# Patient Record
Sex: Female | Born: 1995 | Race: Black or African American | Hispanic: No | Marital: Single | Smoking: Current some day smoker
Health system: Southern US, Community
[De-identification: ages and names within clinical notes are randomized; demographics above are authoritative.]

## PROBLEM LIST (undated history)

## (undated) DIAGNOSIS — I82409 Acute embolism and thrombosis of unspecified deep veins of unspecified lower extremity: Secondary | ICD-10-CM

---

## 2018-05-22 DIAGNOSIS — I2699 Other pulmonary embolism without acute cor pulmonale: Secondary | ICD-10-CM

## 2018-05-22 HISTORY — DX: Other pulmonary embolism without acute cor pulmonale: I26.99

## 2019-12-31 ENCOUNTER — Emergency Department: Payer: Self-pay

## 2019-12-31 ENCOUNTER — Observation Stay: Payer: Self-pay

## 2019-12-31 ENCOUNTER — Other Ambulatory Visit: Payer: Self-pay

## 2019-12-31 ENCOUNTER — Inpatient Hospital Stay
Admission: EM | Admit: 2019-12-31 | Discharge: 2020-01-06 | DRG: 270 | Disposition: A | Discharge status: Home / Self Care | Payer: Self-pay | Attending: Internal Medicine | Admitting: Internal Medicine

## 2019-12-31 DIAGNOSIS — Z86711 Personal history of pulmonary embolism: Secondary | ICD-10-CM

## 2019-12-31 DIAGNOSIS — D696 Thrombocytopenia, unspecified: Secondary | ICD-10-CM

## 2019-12-31 DIAGNOSIS — D6959 Other secondary thrombocytopenia: Secondary | ICD-10-CM | POA: Diagnosis present

## 2019-12-31 DIAGNOSIS — I82422 Acute embolism and thrombosis of left iliac vein: Secondary | ICD-10-CM | POA: Diagnosis present

## 2019-12-31 DIAGNOSIS — Z7901 Long term (current) use of anticoagulants: Secondary | ICD-10-CM

## 2019-12-31 DIAGNOSIS — I8290 Acute embolism and thrombosis of unspecified vein: Secondary | ICD-10-CM

## 2019-12-31 DIAGNOSIS — I82409 Acute embolism and thrombosis of unspecified deep veins of unspecified lower extremity: Secondary | ICD-10-CM | POA: Diagnosis present

## 2019-12-31 DIAGNOSIS — R000 Tachycardia, unspecified: Secondary | ICD-10-CM | POA: Diagnosis present

## 2019-12-31 DIAGNOSIS — I82220 Acute embolism and thrombosis of inferior vena cava: Secondary | ICD-10-CM | POA: Diagnosis present

## 2019-12-31 DIAGNOSIS — I82432 Acute embolism and thrombosis of left popliteal vein: Secondary | ICD-10-CM | POA: Diagnosis present

## 2019-12-31 DIAGNOSIS — Z86718 Personal history of other venous thrombosis and embolism: Secondary | ICD-10-CM

## 2019-12-31 DIAGNOSIS — Z20822 Contact with and (suspected) exposure to covid-19: Secondary | ICD-10-CM | POA: Diagnosis present

## 2019-12-31 DIAGNOSIS — I824Y2 Acute embolism and thrombosis of unspecified deep veins of left proximal lower extremity: Secondary | ICD-10-CM

## 2019-12-31 DIAGNOSIS — I82442 Acute embolism and thrombosis of left tibial vein: Secondary | ICD-10-CM | POA: Diagnosis present

## 2019-12-31 DIAGNOSIS — D509 Iron deficiency anemia, unspecified: Secondary | ICD-10-CM

## 2019-12-31 DIAGNOSIS — I824Z2 Acute embolism and thrombosis of unspecified deep veins of left distal lower extremity: Secondary | ICD-10-CM

## 2019-12-31 DIAGNOSIS — I2693 Single subsegmental pulmonary embolism without acute cor pulmonale: Secondary | ICD-10-CM

## 2019-12-31 DIAGNOSIS — I82412 Acute embolism and thrombosis of left femoral vein: Principal | ICD-10-CM | POA: Diagnosis not present

## 2019-12-31 DIAGNOSIS — F17290 Nicotine dependence, other tobacco product, uncomplicated: Secondary | ICD-10-CM | POA: Diagnosis present

## 2019-12-31 DIAGNOSIS — D72829 Elevated white blood cell count, unspecified: Secondary | ICD-10-CM | POA: Diagnosis present

## 2019-12-31 DIAGNOSIS — Z6841 Body Mass Index (BMI) 40.0 and over, adult: Secondary | ICD-10-CM

## 2019-12-31 DIAGNOSIS — I2699 Other pulmonary embolism without acute cor pulmonale: Secondary | ICD-10-CM | POA: Diagnosis present

## 2019-12-31 DIAGNOSIS — G43909 Migraine, unspecified, not intractable, without status migrainosus: Secondary | ICD-10-CM | POA: Diagnosis present

## 2019-12-31 DIAGNOSIS — I959 Hypotension, unspecified: Secondary | ICD-10-CM | POA: Diagnosis not present

## 2019-12-31 HISTORY — DX: Acute embolism and thrombosis of unspecified deep veins of unspecified lower extremity: I82.409

## 2019-12-31 LAB — CBC WITH DIFFERENTIAL/PLATELET
Abs Immature Granulocytes: 0.06 10*3/uL (ref 0.00–0.07)
Basophils Absolute: 0.1 10*3/uL (ref 0.0–0.1)
Basophils Relative: 1 %
Eosinophils Absolute: 0.3 10*3/uL (ref 0.0–0.5)
Eosinophils Relative: 2 %
HCT: 31.9 % — ABNORMAL LOW (ref 36.0–46.0)
Hemoglobin: 9.8 g/dL — ABNORMAL LOW (ref 12.0–15.0)
Immature Granulocytes: 0 %
Lymphocytes Relative: 19 %
Lymphs Abs: 2.7 10*3/uL (ref 0.7–4.0)
MCH: 23.8 pg — ABNORMAL LOW (ref 26.0–34.0)
MCHC: 30.7 g/dL (ref 30.0–36.0)
MCV: 77.6 fL — ABNORMAL LOW (ref 80.0–100.0)
Monocytes Absolute: 0.6 10*3/uL (ref 0.1–1.0)
Monocytes Relative: 5 %
Neutro Abs: 10.3 10*3/uL — ABNORMAL HIGH (ref 1.7–7.7)
Neutrophils Relative %: 73 %
Platelets: 120 10*3/uL — ABNORMAL LOW (ref 150–400)
RBC: 4.11 MIL/uL (ref 3.87–5.11)
RDW: 18.6 % — ABNORMAL HIGH (ref 11.5–15.5)
WBC: 14.2 10*3/uL — ABNORMAL HIGH (ref 4.0–10.5)
nRBC: 0 % (ref 0.0–0.2)

## 2019-12-31 LAB — COMPREHENSIVE METABOLIC PANEL
ALT: 14 U/L (ref 0–44)
AST: 17 U/L (ref 15–41)
Albumin: 4.2 g/dL (ref 3.5–5.0)
Alkaline Phosphatase: 63 U/L (ref 38–126)
Anion gap: 12 (ref 5–15)
BUN: 12 mg/dL (ref 6–20)
CO2: 25 mmol/L (ref 22–32)
Calcium: 9 mg/dL (ref 8.9–10.3)
Chloride: 102 mmol/L (ref 98–111)
Creatinine, Ser: 0.79 mg/dL (ref 0.44–1.00)
GFR calc Af Amer: >60 mL/min (ref >60)
GFR calc non Af Amer: >60 mL/min (ref >60)
Glucose, Bld: 110 mg/dL — ABNORMAL HIGH (ref 70–99)
Potassium: 3.7 mmol/L (ref 3.5–5.1)
Sodium: 139 mmol/L (ref 135–145)
Total Bilirubin: 0.3 mg/dL (ref 0.3–1.2)
Total Protein: 8.1 g/dL (ref 6.5–8.1)

## 2019-12-31 LAB — PROTIME-INR
INR: 1.1 (ref 0.8–1.2)
Prothrombin Time: 13.6 seconds (ref 11.4–15.2)

## 2019-12-31 LAB — URINALYSIS, COMPLETE (UACMP) WITH MICROSCOPIC
Bilirubin Urine: NEGATIVE
Glucose, UA: NEGATIVE mg/dL
Ketones, ur: NEGATIVE mg/dL
Nitrite: NEGATIVE
Protein, ur: 30 mg/dL — AB
Specific Gravity, Urine: 1.024 (ref 1.005–1.030)
pH: 5 (ref 5.0–8.0)

## 2019-12-31 LAB — POCT PREGNANCY, URINE: Preg Test, Ur: NEGATIVE

## 2019-12-31 LAB — BRAIN NATRIURETIC PEPTIDE: B Natriuretic Peptide: 52.3 pg/mL (ref 0.0–100.0)

## 2019-12-31 LAB — SARS CORONAVIRUS 2 BY RT PCR (HOSPITAL ORDER, PERFORMED IN ~~LOC~~ HOSPITAL LAB): SARS Coronavirus 2: NEGATIVE

## 2019-12-31 LAB — APTT: aPTT: 95 seconds — ABNORMAL HIGH (ref 24–36)

## 2019-12-31 LAB — LACTIC ACID, PLASMA
Lactic Acid, Venous: 1.3 mmol/L (ref 0.5–1.9)
Lactic Acid, Venous: 2 mmol/L (ref 0.5–1.9)

## 2019-12-31 MED ORDER — IOHEXOL 350 MG/ML SOLN
75.0000 mL | Freq: Once | INTRAVENOUS | Status: AC | PRN
  Administered 2019-12-31: 75 mL via INTRAVENOUS

## 2019-12-31 MED ORDER — HEPARIN (PORCINE) 25000 UT/250ML-% IV SOLN
1200.0000 [IU]/h | INTRAVENOUS | Status: DC
  Administered 2019-12-31: 1000 [IU]/h via INTRAVENOUS
  Administered 2020-01-01: 1200 [IU]/h via INTRAVENOUS
  Filled 2019-12-31 (×2): qty 250

## 2019-12-31 MED ORDER — MORPHINE SULFATE (PF) 2 MG/ML IV SOLN
1.0000 mg | INTRAVENOUS | Status: DC | PRN
  Administered 2019-12-31 – 2020-01-01 (×2): 1 mg via INTRAVENOUS
  Filled 2019-12-31 (×2): qty 1

## 2019-12-31 MED ORDER — ONDANSETRON 4 MG PO TBDP
4.0000 mg | ORAL_TABLET | Freq: Once | ORAL | Status: AC
  Administered 2019-12-31: 4 mg via ORAL
  Filled 2019-12-31: qty 1

## 2019-12-31 MED ORDER — OXYCODONE-ACETAMINOPHEN 5-325 MG PO TABS
1.0000 | ORAL_TABLET | Freq: Once | ORAL | Status: AC
  Administered 2019-12-31: 1 via ORAL
  Filled 2019-12-31: qty 1

## 2019-12-31 MED ORDER — HEPARIN BOLUS VIA INFUSION
4200.0000 [IU] | Freq: Once | INTRAVENOUS | Status: AC
  Administered 2019-12-31: 4200 [IU] via INTRAVENOUS
  Filled 2019-12-31: qty 4200

## 2019-12-31 NOTE — ED Notes (Signed)
Transport tech requested.

## 2019-12-31 NOTE — ED Triage Notes (Signed)
First nurse note- pt here for leg pain.  Hx DVT, has not been taking her blood thinners.  Taking ASA only. VSS with EMS.

## 2019-12-31 NOTE — Progress Notes (Addendum)
Cross Cover Brief Note Paged from [facility] radiologist who reported patient to have pulmonary embolism left lower lobe.  Patient already with heparin drip order for DVT treatment. Secure chat to current attending, Dr. Cyndia Bent, sent

## 2019-12-31 NOTE — H&P (Signed)
History and Physical    [facility] FHL:[id number] DOB: 11-Aug-1995 DOA: 12/31/2019  PCP: Patient, No Pcp Per  Patient coming from: Truck station  I have personally briefly reviewed patient's old medical records in [facility]  Chief Complaint: Left lower extremity pain and swelling  HPI: Caroline Barry is a 24 y.o. female with medical history significant for PE, central venous thrombosis, migraine, tobacco use who presents with concern of left leg edema and increased pain.  Patient says that for the past 2 days she has noticed increased asymmetric swelling of her left lower leg.  Also began to notice painful cramps from her hip down to her knees.  Also from knee down to her feet she feels numbness and tingling.  She has a hard time walking due to the pain behind her knees.  Patient uses tobacco and smokes cigars.  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  She had history of PE last year and a central venous thrombus about 2 years ago.  She was never told the reasoning for this.  She was started on Eliquis but stopped due to financial constraints.  Now she only takes aspirin occasionally.  She denies any chest pain or shortness of breath which she has felt before during her prior PE.  She denies any use of hormonal birth control.  Denies any family history of clotting disorders.  ED Course: She was tachycardic and normotensive on room air. CBC shows leukocytosis of 14.2, microcytic anemia of 9.8 with no prior for comparison.  Platelet of 120.  Review of Systems:  Constitutional: No Weight Change, No Fever ENT/Mouth: No sore throat, No Rhinorrhea Eyes: No Eye Pain, No Vision Changes Cardiovascular: No Chest Pain, no SOB, +Edema, No Palpitations Respiratory: No Cough, No Sputum Gastrointestinal: No Nausea, No Vomiting, No Diarrhea, No Constipation, No Pain Genitourinary: no Urinary Incontinence Musculoskeletal: No Arthralgias,  No Myalgias Skin: No Skin Lesions, No Pruritus, Neuro: no Weakness, + Numbness Psych: No Anxiety/Panic, No Depression, no decrease appetite Heme/Lymph: No Bruising, No Bleeding  Past Medical History:  Diagnosis Date  . DVT (deep venous thrombosis) (HCC)     History reviewed. No pertinent surgical history.   reports that she has been smoking. She does not have any smokeless tobacco history on file. She reports current alcohol use. No history on file for drug use.  Drinks about 1 bottle of wine every time she is home from her truck driving.  States the last time she drank was 2 weeks ago.  Social History  No Known Allergies  No known family history of clotting disorders.  Prior to Admission medications   Medication Sig Start Date End Date Taking? Authorizing Provider  apixaban (ELIQUIS) 5 MG TABS tablet Take 5 mg by mouth 2 (two) times daily.   Yes [provider]    Physical Exam: Vitals:   12/31/19 1425 12/31/19 1945  BP: (!) 144/115 117/63  Pulse: (!) 130 98  Resp: 16 16  Temp: 98.2 F (36.8 C) 98.3 F (36.8 C)  TempSrc: Oral Oral  SpO2: 100% 100%  Weight: 63 kg   Height: 5\' 5"  (1.651 m)     Constitutional: NAD, calm, comfortable, young female laying at 40 degree incline in bed Vitals:   12/31/19 1425 12/31/19 1945  BP: (!) 144/115 117/63  Pulse: (!) 130 98  Resp: 16 16  Temp: 98.2 F (36.8 C) 98.3 F (36.8 C)  TempSrc: Oral Oral  SpO2:  100% 100%  Weight: 63 kg   Height: 5\' 5"  (1.651 m)    Eyes: PERRL, lids and conjunctivae normal ENMT: Mucous membranes are moist.  Neck: normal, supple Respiratory: clear to auscultation bilaterally, no wheezing, no crackles. Normal respiratory effort. No accessory muscle use.  Cardiovascular: Sinus tachycardia, no murmurs / rubs / gallops.  +3 nonpitting edema of the left lower extremity up to hip compared to the right.  Difficulty palpating left dorsalis pedis pulse due to edema.   Abdomen: no tenderness, no  masses palpated.  Bowel sounds positive.  Musculoskeletal: no clubbing / cyanosis. No joint deformity upper and lower extremities. Good ROM, no contractures. Normal muscle tone.  Skin: no rashes, lesions, ulcers. No induration Neurologic: CN 2-12 grossly intact. Sensation intact, Strength 5/5 in all 4.  Psychiatric: Normal judgment and insight. Alert and oriented x 3. Normal mood.     Labs on Admission: I have personally reviewed following labs and imaging studies  CBC: Recent Labs  Lab 12/31/19 1602  WBC 14.2*  NEUTROABS 10.3*  HGB 9.8*  HCT 31.9*  MCV 77.6*  PLT 120*   Basic Metabolic Panel: Recent Labs  Lab 12/31/19 1602  NA 139  K 3.7  CL 102  CO2 25  GLUCOSE 110*  BUN 12  CREATININE 0.79  CALCIUM 9.0   GFR: Estimated Creatinine Clearance: 96.7 mL/min (by C-G formula based on SCr of 0.79 mg/dL). Liver Function Tests: Recent Labs  Lab 12/31/19 1602  AST 17  ALT 14  ALKPHOS 63  BILITOT 0.3  PROT 8.1  ALBUMIN 4.2   No results for input(s): LIPASE, AMYLASE in the last 168 hours. No results for input(s): AMMONIA in the last 168 hours. Coagulation Profile: Recent Labs  Lab 12/31/19 1602  INR 1.1   Cardiac Enzymes: No results for input(s): CKTOTAL, CKMB, CKMBINDEX, TROPONINI in the last 168 hours. BNP (last 3 results) No results for input(s): PROBNP in the last 8760 hours. HbA1C: No results for input(s): HGBA1C in the last 72 hours. CBG: No results for input(s): GLUCAP in the last 168 hours. Lipid Profile: No results for input(s): CHOL, HDL, LDLCALC, TRIG, CHOLHDL, LDLDIRECT in the last 72 hours. Thyroid Function Tests: No results for input(s): TSH, T4TOTAL, FREET4, T3FREE, THYROIDAB in the last 72 hours. Anemia Panel: No results for input(s): VITAMINB12, FOLATE, FERRITIN, TIBC, IRON, RETICCTPCT in the last 72 hours. Urine analysis:    Component Value Date/Time   COLORURINE YELLOW (A) 12/31/2019 1603   APPEARANCEUR CLOUDY (A) 12/31/2019 1603    LABSPEC 1.024 12/31/2019 1603   PHURINE 5.0 12/31/2019 1603   GLUCOSEU NEGATIVE 12/31/2019 1603   HGBUR MODERATE (A) 12/31/2019 1603   BILIRUBINUR NEGATIVE 12/31/2019 1603   KETONESUR NEGATIVE 12/31/2019 1603   PROTEINUR 30 (A) 12/31/2019 1603   NITRITE NEGATIVE 12/31/2019 1603   LEUKOCYTESUR MODERATE (A) 12/31/2019 1603    Radiological Exams on Admission: 03/01/2020 Venous Img Lower Unilateral Left  Result Date: 12/31/2019 CLINICAL DATA:  PE. EXAM: LEFT LOWER EXTREMITY VENOUS DOPPLER ULTRASOUND TECHNIQUE: Gray-scale sonography with compression, as well as color and duplex ultrasound, were performed to evaluate the deep venous system(s) from the level of the common femoral vein through the popliteal and proximal calf veins. COMPARISON:  None. FINDINGS: VENOUS There is extensive occlusive thrombus throughout the left lower extremity including the left CFV, profundus femoral, superficial femoral vein, popliteal vein, and tibial veins. Limited views of the contralateral common femoral vein are unremarkable. OTHER Lower extremity edema is noted. Limitations: none IMPRESSION: Extensive  occlusive thrombus is noted involving essentially all of the deep veins of the left lower extremity. These results were called by telephone at the time of interpretation on 12/31/2019 at 5:15 pm to provider JENISE MENSHEW , who verbally acknowledged these results. Electronically Signed   By: Katherine Mantle M.D.   On: 12/31/2019 17:18      Assessment/Plan  DVT of the left lower extremity Ultrasound shows DVT of all deep veins in the left lower extremity Dr. Wyn Quaker with vascular surgery is aware and will take her for thrombectomy in the morning IV heparin infusion overnight.  N.p.o. after midnight. PRN pain management   Left lower lobe pulmonary embolism No evidence of right heart strain Continue IV heparin infusion  Microcytic anemia Stable and last hemoglobin a year ago was about 9 Will check iron  panel  Thrombocytopenia Likely secondary to significant DVT and PE.  Monitor while on IV heparin infusion.  Tobacco use Discussed cessation especially with her significant history of DVTs and PE  DVT prophylaxis: IV heparin infusion  code Status: Full Family Communication: Plan discussed with patient at bedside  disposition Plan: Home with observation Consults called: Vascular surgery Dr. Wyn Quaker Admission status: Observation Status is: Observation  The patient remains OBS appropriate and will d/c before 2 midnights.  Dispo: The patient is from: Home              Anticipated d/c is to: Home              Anticipated d/c date is: 1 day              Patient currently is not medically stable to d/c.         Anselm Jungling DO [facility]   If 7PM-7AM, please contact night-coverage [email or web address]   12/31/2019, 8:12 PM

## 2019-12-31 NOTE — ED Notes (Signed)
See triage note  Presents with pain and swelling to left leg  States she has had intermittent swelling to ankles in past  Increased pain with swelling 2 days ago

## 2019-12-31 NOTE — Progress Notes (Signed)
Patient lactic acid 2.0. Steward Drone NP notified. Acknowledged. No orders received.

## 2019-12-31 NOTE — Progress Notes (Signed)
ANTICOAGULATION CONSULT NOTE  Pharmacy Consult for heparin Indication: DVT  No Known Allergies  Patient Measurements: Height: 5\' 5"  (165.1 cm) Weight: 63 kg (139 lb) IBW/kg (Calculated) : 57 Heparin Dosing Weight: 63 kg  Vital Signs: Temp: 98.2 F (36.8 C) (08/11 1425) Temp Source: Oral (08/11 1425) BP: 144/115 (08/11 1425) Pulse Rate: 130 (08/11 1425)  Labs: Recent Labs    12/31/19 1602  HGB 9.8*  HCT 31.9*  PLT 120*  LABPROT 13.6  INR 1.1  CREATININE 0.79    Estimated Creatinine Clearance: 96.7 mL/min (by C-G formula based on SCr of 0.79 mg/dL).   Medical History: Past Medical History:  Diagnosis Date  . DVT (deep venous thrombosis) [facility])     Assessment: 24 year old female presented with leg pain. [place] H/o PE and cerebral venous thrombosis, previously on Eliquis but has not taken for approximately one year per ED note. EDP discussed with Vascular and it appears current plan is for thrombectomy. Patient to start on heparin drip in the interim.  Goal of Therapy:  Heparin level 0.3-0.7 units/ml Monitor platelets by anticoagulation protocol: Yes   Plan:  Heparin 4200 unit bolus followed by heparin drip at 1000 units/hr. HL 8/12 at 0200. CBC daily while on heparin drip.  10/12, PharmD 12/31/2019,7:22 PM

## 2019-12-31 NOTE — ED Triage Notes (Signed)
Pt arrived to ER via EMS for L leg pain x last few days. States she twisted her hip. A&O, in wheelchair.

## 2019-12-31 NOTE — ED Provider Notes (Signed)
[facility] Emergency Department Provider Note ____________________________________________  Time seen: 25  I have reviewed the triage vital signs and the nursing notes.  HISTORY  Chief Complaint  Leg Pain  HPI Caroline Barry is a 24 y.o. female presents her self to the ED for 2 to 3 days of progressive left lower extremity swelling.  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  She presents today due to increasing pain, swelling, disability to the left lower extremity.  Patient gives a remote history of PE and  cerebral venous thrombosis, who was previously on Eliquis.  She reports she last dose of medication about a year ago after she lost her insurance benefits.  She denies any recent trauma, injury, or fall.  She also denies any chest pain, shortness of breath, headache, or weakness.  Patient presents with obvious swelling to the left lower extremity from the hip to the foot.  Past Medical History:  Diagnosis Date  . DVT (deep venous thrombosis) (HCC)     There are no problems to display for this patient.   History reviewed. No pertinent surgical history.  Prior to Admission medications   Medication Sig Start Date End Date Taking? Authorizing Provider  apixaban (ELIQUIS) 5 MG TABS tablet Take 5 mg by mouth 2 (two) times daily.   Yes [provider]    Allergies Patient has no known allergies.  History reviewed. No pertinent family history.  Social History Social History   Tobacco Use  . Smoking status: Current Some Day Smoker  Substance Use Topics  . Alcohol use: Yes  . Drug use: Not on file    Review of Systems  Constitutional: Negative for fever. Eyes: Negative for visual changes. ENT: Negative for sore throat. Cardiovascular: Negative for chest pain.  Left lower extremity swelling as above. Respiratory: Negative for shortness of breath. Gastrointestinal: Negative for abdominal pain, vomiting  and diarrhea. Genitourinary: Negative for dysuria. Musculoskeletal: Negative for back pain. Skin: Negative for rash. Neurological: Negative for headaches, focal weakness or numbness. ____________________________________________  PHYSICAL EXAM:  VITAL SIGNS: ED Triage Vitals [12/31/19 1425]  Enc Vitals Group     BP (!) 144/115     Pulse Rate (!) 130     Resp 16     Temp 98.2 F (36.8 C)     Temp Source Oral     SpO2 100 %     Weight 139 lb (63 kg)     Height 5\' 5"  (1.651 m)     Head Circumference      Peak Flow      Pain Score 8     Pain Loc      Pain Edu?      Excl. in GC?     Constitutional: Alert and oriented. Well appearing and in no distress. Head: Normocephalic and atraumatic. Eyes: Conjunctivae are normal. Normal extraocular movements Cardiovascular: Normal rate, regular rhythm.  Normal capillary refill distally. Respiratory: Normal respiratory effort. No wheezes/rales/rhonchi. Gastrointestinal: Soft and nontender. No distention. Musculoskeletal: Nontender with normal range of motion in all extremities.  Left lower extremity with obvious edema from the groin to the foot.  Skin is tight, shiny, and warm to touch.  No skin breakdown, ecchymosis, or abrasions are appreciated. Neurologic:  Normal gait without ataxia. Normal speech and language. No gross focal neurologic deficits are appreciated. Skin:  Skin is warm, dry and intact. No rash noted. Psychiatric: Mood and affect are normal. Patient exhibits appropriate insight and judgment.  ____________________________________________   LABS (pertinent positives/negatives)  Labs Reviewed  COMPREHENSIVE METABOLIC PANEL - Abnormal; Notable for the following components:      Result Value   Glucose, Bld 110 (*)    All other components within normal limits  CBC WITH DIFFERENTIAL/PLATELET - Abnormal; Notable for the following components:   WBC 14.2 (*)    Hemoglobin 9.8 (*)    HCT 31.9 (*)    MCV 77.6 (*)    MCH 23.8 (*)     RDW 18.6 (*)    Platelets 120 (*)    Neutro Abs 10.3 (*)    All other components within normal limits  URINALYSIS, COMPLETE (UACMP) WITH MICROSCOPIC - Abnormal; Notable for the following components:   Color, Urine YELLOW (*)    APPearance CLOUDY (*)    Hgb urine dipstick MODERATE (*)    Protein, ur 30 (*)    Leukocytes,Ua MODERATE (*)    Bacteria, UA FEW (*)    All other components within normal limits  SARS CORONAVIRUS 2 BY RT PCR (HOSPITAL ORDER, PERFORMED IN  HOSPITAL LAB)  BRAIN NATRIURETIC PEPTIDE  LACTIC ACID, PLASMA  PROTIME-INR  LACTIC ACID, PLASMA  POC URINE PREG, ED  POCT PREGNANCY, URINE  ____________________________________________   RADIOLOGY  US Venous LLE  IMPRESSION: Extensive occlusive thrombus is noted involving essentially all of the deep veins of the left lower extremity.  These results were called by telephone at the time of interpretation on 12/31/2019 at 5:15 pm to provider Conley Pawling , who verbally acknowledged these results. ____________________________________________  PROCEDURES  Percocet 5-325 mg PO Zofran 4 mg ODT  Procedures ____________________________________________  INITIAL IMPRESSION / ASSESSMENT AND PLAN / ED COURSE  ----------------------------------------- 5:46 PM on 12/31/2019 ----------------------------------------- S/W Dr. Wyn Quaker: he agrees patient wound benefit from urgent thrombectomy. He suggests admit to hospitalist service    [facility] was evaluated in Emergency Department on 12/31/2019 for the symptoms described in the history of present illness. She was evaluated in the context of the global COVID-19 pandemic, which necessitated consideration that the patient might be at risk for infection with the SARS-CoV-2 virus that causes COVID-19. Institutional protocols and algorithms that pertain to the evaluation of patients at risk for COVID-19 are in a state of rapid change based on information  released by regulatory bodies including the CDC and federal and state organizations. These policies and algorithms were followed during the patient's care in the ED. ____________________________________________  FINAL CLINICAL IMPRESSION(S) / ED DIAGNOSES  Final diagnoses:  Occlusive thrombus  Acute deep vein thrombosis (DVT) of distal vein of left lower extremity (HCC)      Karmen Stabs, Charlesetta Ivory, PA-C 12/31/19 [place]    Phineas Semen, MD 12/31/19 2321

## 2020-01-01 ENCOUNTER — Other Ambulatory Visit (INDEPENDENT_AMBULATORY_CARE_PROVIDER_SITE_OTHER): Payer: Self-pay | Admitting: Vascular Surgery

## 2020-01-01 ENCOUNTER — Encounter: Payer: Self-pay | Admitting: Family Medicine

## 2020-01-01 ENCOUNTER — Encounter
Admission: EM | Disposition: A | Discharge status: Home / Self Care | Payer: Self-pay | Source: Home / Self Care | Attending: Internal Medicine

## 2020-01-01 DIAGNOSIS — I82402 Acute embolism and thrombosis of unspecified deep veins of left lower extremity: Secondary | ICD-10-CM

## 2020-01-01 DIAGNOSIS — I8290 Acute embolism and thrombosis of unspecified vein: Secondary | ICD-10-CM

## 2020-01-01 DIAGNOSIS — I824Z2 Acute embolism and thrombosis of unspecified deep veins of left distal lower extremity: Secondary | ICD-10-CM

## 2020-01-01 HISTORY — PX: PERIPHERAL VASCULAR THROMBECTOMY: CATH118306

## 2020-01-01 LAB — CBC
HCT: 29.5 % — ABNORMAL LOW (ref 36.0–46.0)
HCT: 28.6 % — ABNORMAL LOW (ref 36.0–46.0)
HCT: 27.1 % — ABNORMAL LOW (ref 36.0–46.0)
Hemoglobin: 9.1 g/dL — ABNORMAL LOW (ref 12.0–15.0)
Hemoglobin: 8.3 g/dL — ABNORMAL LOW (ref 12.0–15.0)
Hemoglobin: 8.7 g/dL — ABNORMAL LOW (ref 12.0–15.0)
MCH: 23.6 pg — ABNORMAL LOW (ref 26.0–34.0)
MCH: 23.4 pg — ABNORMAL LOW (ref 26.0–34.0)
MCH: 24.3 pg — ABNORMAL LOW (ref 26.0–34.0)
MCHC: 30.8 g/dL (ref 30.0–36.0)
MCHC: 29 g/dL — ABNORMAL LOW (ref 30.0–36.0)
MCHC: 32.1 g/dL (ref 30.0–36.0)
MCV: 76.4 fL — ABNORMAL LOW (ref 80.0–100.0)
MCV: 80.6 fL (ref 80.0–100.0)
MCV: 75.7 fL — ABNORMAL LOW (ref 80.0–100.0)
Platelets: 142 10*3/uL — ABNORMAL LOW (ref 150–400)
Platelets: 125 10*3/uL — ABNORMAL LOW (ref 150–400)
Platelets: 72 10*3/uL — ABNORMAL LOW (ref 150–400)
RBC: 3.86 MIL/uL — ABNORMAL LOW (ref 3.87–5.11)
RBC: 3.55 MIL/uL — ABNORMAL LOW (ref 3.87–5.11)
RBC: 3.58 MIL/uL — ABNORMAL LOW (ref 3.87–5.11)
RDW: 18.4 % — ABNORMAL HIGH (ref 11.5–15.5)
RDW: 18.3 % — ABNORMAL HIGH (ref 11.5–15.5)
RDW: 17.9 % — ABNORMAL HIGH (ref 11.5–15.5)
WBC: 11.5 10*3/uL — ABNORMAL HIGH (ref 4.0–10.5)
WBC: 7 10*3/uL (ref 4.0–10.5)
WBC: 17.9 10*3/uL — ABNORMAL HIGH (ref 4.0–10.5)
nRBC: 0 % (ref 0.0–0.2)
nRBC: 0 % (ref 0.0–0.2)
nRBC: 0.2 % (ref 0.0–0.2)

## 2020-01-01 LAB — BASIC METABOLIC PANEL
Anion gap: 9 (ref 5–15)
BUN: 12 mg/dL (ref 6–20)
CO2: 23 mmol/L (ref 22–32)
Calcium: 8.5 mg/dL — ABNORMAL LOW (ref 8.9–10.3)
Chloride: 106 mmol/L (ref 98–111)
Creatinine, Ser: 0.58 mg/dL (ref 0.44–1.00)
GFR calc Af Amer: >60 mL/min (ref >60)
GFR calc non Af Amer: >60 mL/min (ref >60)
Glucose, Bld: 107 mg/dL — ABNORMAL HIGH (ref 70–99)
Potassium: 3.9 mmol/L (ref 3.5–5.1)
Sodium: 138 mmol/L (ref 135–145)

## 2020-01-01 LAB — HEPARIN LEVEL (UNFRACTIONATED)
Heparin Unfractionated: 0.36 IU/mL (ref 0.30–0.70)
Heparin Unfractionated: 0.3 IU/mL (ref 0.30–0.70)
Heparin Unfractionated: 0.17 IU/mL — ABNORMAL LOW (ref 0.30–0.70)

## 2020-01-01 LAB — IRON AND TIBC
Iron: 10 ug/dL — ABNORMAL LOW (ref 28–170)
Saturation Ratios: 3 % — ABNORMAL LOW (ref 10.4–31.8)
TIBC: 386 ug/dL (ref 250–450)
UIBC: 376 ug/dL

## 2020-01-01 LAB — MRSA PCR SCREENING: MRSA by PCR: NEGATIVE

## 2020-01-01 LAB — ABO/RH: ABO/RH(D): A POS

## 2020-01-01 LAB — HIV ANTIBODY (ROUTINE TESTING W REFLEX): HIV Screen 4th Generation wRfx: NONREACTIVE

## 2020-01-01 SURGERY — PERIPHERAL VASCULAR THROMBECTOMY
Anesthesia: Moderate Sedation | Laterality: Left

## 2020-01-01 MED ORDER — ONDANSETRON HCL 4 MG/2ML IJ SOLN
4.0000 mg | Freq: Four times a day (QID) | INTRAMUSCULAR | Status: DC | PRN
  Administered 2020-01-01 – 2020-01-02 (×3): 4 mg via INTRAVENOUS
  Filled 2020-01-01 (×2): qty 2

## 2020-01-01 MED ORDER — ALTEPLASE 2 MG IJ SOLR
INTRAMUSCULAR | Status: DC | PRN
  Administered 2020-01-01: 10 mg

## 2020-01-01 MED ORDER — SODIUM CHLORIDE 0.9 % IV SOLN
25.0000 ug/min | INTRAVENOUS | Status: DC
  Filled 2020-01-01: qty 1

## 2020-01-01 MED ORDER — MIDAZOLAM HCL 5 MG/5ML IJ SOLN
INTRAMUSCULAR | Status: AC
  Filled 2020-01-01: qty 5

## 2020-01-01 MED ORDER — HEPARIN SODIUM (PORCINE) 1000 UNIT/ML IJ SOLN
INTRAMUSCULAR | Status: AC
  Filled 2020-01-01: qty 1

## 2020-01-01 MED ORDER — ONDANSETRON HCL 4 MG/2ML IJ SOLN
INTRAMUSCULAR | Status: AC
  Filled 2020-01-01: qty 2

## 2020-01-01 MED ORDER — PHENYLEPHRINE HCL (PRESSORS) 10 MG/ML IV SOLN
INTRAVENOUS | Status: AC
  Filled 2020-01-01: qty 1

## 2020-01-01 MED ORDER — MIDAZOLAM HCL 2 MG/2ML IJ SOLN
INTRAMUSCULAR | Status: DC | PRN
  Administered 2020-01-01 (×2): 2 mg via INTRAVENOUS

## 2020-01-01 MED ORDER — CHLORHEXIDINE GLUCONATE CLOTH 2 % EX PADS
6.0000 | MEDICATED_PAD | Freq: Every day | CUTANEOUS | Status: DC
  Administered 2020-01-01: 6 via TOPICAL

## 2020-01-01 MED ORDER — MIDAZOLAM HCL 2 MG/ML PO SYRP: 8.0000 mg | ORAL_SOLUTION | Freq: Once | ORAL | Status: DC | PRN

## 2020-01-01 MED ORDER — HYDROMORPHONE HCL 1 MG/ML IJ SOLN
INTRAMUSCULAR | Status: AC
  Filled 2020-01-01: qty 0.5

## 2020-01-01 MED ORDER — HYDROMORPHONE HCL 1 MG/ML IJ SOLN
1.0000 mg | Freq: Once | INTRAMUSCULAR | Status: AC | PRN
  Administered 2020-01-02: 1 mg via INTRAVENOUS
  Filled 2020-01-01: qty 1

## 2020-01-01 MED ORDER — METHYLPREDNISOLONE SODIUM SUCC 125 MG IJ SOLR: 125.0000 mg | Freq: Once | INTRAMUSCULAR | Status: DC | PRN

## 2020-01-01 MED ORDER — FENTANYL CITRATE (PF) 100 MCG/2ML IJ SOLN
INTRAMUSCULAR | Status: AC
  Filled 2020-01-01: qty 2

## 2020-01-01 MED ORDER — CEFAZOLIN SODIUM-DEXTROSE 2-4 GM/100ML-% IV SOLN
INTRAVENOUS | Status: AC
  Filled 2020-01-01: qty 100

## 2020-01-01 MED ORDER — DIPHENHYDRAMINE HCL 50 MG/ML IJ SOLN: 50.0000 mg | Freq: Once | INTRAMUSCULAR | Status: DC | PRN

## 2020-01-01 MED ORDER — SODIUM CHLORIDE 0.9 % IV SOLN: 250.0000 mL | INTRAVENOUS | Status: DC

## 2020-01-01 MED ORDER — CEFAZOLIN SODIUM-DEXTROSE 2-4 GM/100ML-% IV SOLN
INTRAVENOUS | Status: AC
  Administered 2020-01-01: 2 g via INTRAVENOUS
  Filled 2020-01-01: qty 100

## 2020-01-01 MED ORDER — FAMOTIDINE 20 MG PO TABS: 40.0000 mg | ORAL_TABLET | Freq: Once | ORAL | Status: DC | PRN

## 2020-01-01 MED ORDER — ALTEPLASE 2 MG IJ SOLR
INTRAMUSCULAR | Status: AC
  Filled 2020-01-01: qty 10

## 2020-01-01 MED ORDER — CEFAZOLIN SODIUM-DEXTROSE 2-4 GM/100ML-% IV SOLN: 2.0000 g | Freq: Once | INTRAVENOUS | Status: AC

## 2020-01-01 MED ORDER — SODIUM CHLORIDE 0.9 % IV SOLN: INTRAVENOUS | Status: DC

## 2020-01-01 MED ORDER — PHENYLEPHRINE HCL-NACL 10-0.9 MG/250ML-% IV SOLN: 0.0000 ug/min | INTRAVENOUS | Status: DC

## 2020-01-01 MED ORDER — FENTANYL CITRATE (PF) 100 MCG/2ML IJ SOLN
INTRAMUSCULAR | Status: DC | PRN
  Administered 2020-01-01 (×3): 50 ug via INTRAVENOUS

## 2020-01-01 SURGICAL SUPPLY — 13 items
BALLN ULTRVRSE 12X80X75 (BALLOONS) ×2
BALLOON ULTRVRSE 12X80X75 (BALLOONS) ×1 IMPLANT
CANISTER PENUMBRA ENGINE (MISCELLANEOUS) ×2 IMPLANT
CANNULA 5F STIFF (CANNULA) ×2 IMPLANT
CATH BEACON 5 .035 65 KMP TIP (CATHETERS) ×2 IMPLANT
CATH INDIGO 12 HTORQ 115 (CATHETERS) ×2 IMPLANT
DEVICE PRESTO INFLATION (MISCELLANEOUS) ×2 IMPLANT
GLIDEWIRE ADV .035X260CM (WIRE) ×2 IMPLANT
PACK ANGIOGRAPHY (CUSTOM PROCEDURE TRAY) ×2 IMPLANT
SHEATH BRITE TIP 6FRX11 (SHEATH) ×2 IMPLANT
SHEATH PINNACLE 11FRX10 (SHEATH) ×2 IMPLANT
TOWEL OR 17X26 4PK STRL BLUE (TOWEL DISPOSABLE) ×2 IMPLANT
WIRE J 3MM .035X145CM (WIRE) ×2 IMPLANT

## 2020-01-01 NOTE — Progress Notes (Signed)
Pt. Med. With Zofran 4 mg IV slow push by Leeann Must, RN. Pt. Restless/; Titrating Neo gtt. Per orders.

## 2020-01-01 NOTE — Op Note (Signed)
[facility]   OPERATIVE NOTE   PRE-OPERATIVE DIAGNOSIS: extensive LLE DVT  POST-OPERATIVE DIAGNOSIS: same with thrombus into the IVC  PROCEDURE: 1.   US guidance for vascular access to left popliteal vein 2.   Catheter placement into left iliac vein from left popliteal approach 3.   IVC gram and left lower extremity venogram 4.   Catheter directed thrombolysis with 10 mg of TPA to the left superficial femoral vein, common femoral vein, external and common iliac veins 5.   Mechanical thrombectomy to left superficial femoral vein, common femoral vein, external iliac vein, common iliac vein, and inferior vena cava 6.   PTA of the left superficial femoral and common femoral vein with 12 mm balloon 7.   PTA of the left external and common iliac veins with 12 mm balloon    SURGEON: Leotis Pain, MD  ASSISTANT(S): none  ANESTHESIA: local with moderate conscious sedation for 45 minutes using 4 mg of Versed and 150 mcg of Fentanyl  ESTIMATED BLOOD LOSS: 450 cc  FINDING(S): 1.   Extensive thrombus from the proximal to mid superficial femoral vein up to the common femoral vein, external iliac vein, and common iliac veins.  There is actually thrombus in the distal vena cava and what appeared to be the proximal portion of the right common iliac vein as well.  SPECIMEN(S):  none  INDICATIONS:    Patient is a 24 y.o. female who presents with extensive left lower extremity DVT with a previous history of DVT 2 years ago.  Patient has marked leg swelling and pain.  Venous intervention is performed to reduce the symtpoms and avoid long term postphlebitic symptoms.    DESCRIPTION: After obtaining full informed written consent, the patient was brought back to the vascular suite and placed supine upon the table. Moderate conscious sedation was administered during a face to face encounter with the patient throughout the procedure with my supervision of the RN administering  medicines and monitoring the patient's vital signs, pulse oximetry, telemetry and mental status throughout from the start of the procedure until the patient was taken to the recovery room.  After obtaining adequate anesthesia, the patient was prepped and draped in the standard fashion.    The patient was then placed into the prone position.  The left popliteal vein was then accessed under direct ultrasound guidance without difficulty with a micropuncture needle and a permanent image was recorded.  I then upsized to an 11Fr sheath over a J wire.  Imaging showed extensive DVT with minimal flow starting in the proximal to mid superficial femoral vein.  A Kumpe catheter and Magic tourque wire were then advanced into the CFV and images were performed.   Stents of thrombus was seen with essentially no flow through the iliac veins.  I then advanced into the iliac veins and imaging showed occlusive thrombus some of which may have been more chronic in nature.  I was able to cross the thrombus and stenosis and advance into the IVC which was small and had thrombus in the distal segment as well.  I then used the Kumpe catheter and instilled 10 mg of tpa throughout the superficial femoral vein, common femoral vein, external and common iliac veins.  After this dwelled, I used the Penumbra Cat 12 catheter and evacuated about 200 cc of effluent with mechanical thrombectomy throughout the iliac veins, CFV, and SFV.  This had mild improvement.  I then treated the SFV, and CFV with an 8  cm long 12 mm diameter angioplasty balloon to open a channel.   I then advanced into the external and the common iliac veins up to the distal IVC and did 2 more inflations with the 12 mm balloon.  Each inflation was 4 to 8 atm for 1 minute.  Imaging following this showed the common femoral vein to have a small to moderate amount of residual thrombus.  The superficial femoral vein was now relatively clear.  The external iliac vein is relatively clear  but there remained thrombus in the common iliac vein and distal IVC.  There also appeared to be thrombus in the proximal portion of the right common iliac vein.  2 more passes with the penumbra CAT 12 device were then performed with a small amount of thrombectomy in the common femoral vein but concentrating in the common iliac vein and IVC.  Some of this appeared to be fairly chronic in nature and may be resulting from her DVT 2 years ago.  Following this, another 200 to 250 cc of effluent were removed and there was only a small amount of residual thrombus in the common femoral vein and the iliac veins.  There is a moderate amount of residual thrombus at the iliac vein confluence in the distal IVC.  With her marked thrombectomy and the amount of blood loss we had encouraged, I felt that further attempts to open this were likely not going to result in much improvement.  I then elected to terminate the procedure.  The sheath was removed and a dressing was placed.  She was taken to the recovery room in stable condition having tolerated the procedure well.    COMPLICATIONS: None  CONDITION: Stable  Leotis Pain 01/01/2020 11:10 AM

## 2020-01-01 NOTE — Progress Notes (Signed)
MD called and made aware of BP,  CBC with plts., + movement to left foot.  No orders at present.

## 2020-01-01 NOTE — Progress Notes (Signed)
Leg wrap removed by K. Stegmayor, PA secondary to pt.c/o pain, numbness and not feeling toes. Left popliteal drsg. Changed secondary to saturated gauze at popliteal access site.

## 2020-01-01 NOTE — Consult Note (Signed)
[facility] Vascular Consult Note  MRN : [id number]  Caroline Barry is a 24 y.o. ([birth year]) female who presents with chief complaint of  Chief Complaint  Patient presents with  . Leg Pain   History of Present Illness: The patient is a 24 year old female who presented to the ED for progressive left lower extremity swelling.    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 She presented to the emergency department with increasing pain, swelling to the left lower extremity.  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   She reports she last dose of medication about a year ago after she lost her insurance benefits.  She denies any recent trauma, injury, or fall.  She also denies any chest pain, shortness of breath, headache, or weakness.   Venous Duplex:  Extensive occlusive thrombus is noted involving essentially all of the deep veins of the left lower extremity.  CTA Chest: Left lower lobe pulmonary emboli. No CT evidence of right heart straining  Vascular surgery was consulted by Dr. Georgeann Oppenheim for possible endovascular intervention.  Current Facility-Administered Medications  Medication Dose Route Frequency Provider Last Rate Last Admin  . ceFAZolin (ANCEF) 2-4 GM/100ML-% IVPB           . fentaNYL (SUBLIMAZE) 100 MCG/2ML injection           . heparin sodium (porcine) 1000 UNIT/ML injection           . midazolam (VERSED) 5 MG/5ML injection           . 0.9 %  sodium chloride infusion   Intravenous Continuous [place] Homewood A, PA-C      . 0.9 %  sodium chloride infusion   Intravenous Continuous Faatima Tench, Ranae Plumber, PA-C      . [START ON 01/02/2020] ceFAZolin (ANCEF) IVPB 2g/100 mL premix  2 g Intravenous Once Gladyes Kudo A, PA-C 200 mL/hr at 01/01/20 1000 2 g at 01/01/20 1000  . diphenhydrAMINE (BENADRYL) injection 50 mg  50 mg Intravenous Once PRN Lacey Wallman,  Oni Dietzman A, PA-C      . famotidine (PEPCID) tablet 40 mg  40 mg Oral Once PRN Teddy Pena, Cala Bradford A, PA-C      . fentaNYL (SUBLIMAZE) injection    PRN Annice Needy, MD   50 mcg at 01/01/20 1000  . heparin ADULT infusion 100 units/mL (25000 units/228mL sodium chloride 0.45%)  1,050 Units/hr Intravenous Continuous Albina Billet, [facility] 10 mL/hr at 12/31/19 2111 1,000 Units/hr at 12/31/19 2111  . [MAR Hold] HYDROmorphone (DILAUDID) injection 1 mg  1 mg Intravenous Once PRN Anaria Kroner A, PA-C      . methylPREDNISolone sodium succinate (SOLU-MEDROL) 125 mg/2 mL injection 125 mg  125 mg Intravenous Once PRN Justiss Gerbino A, PA-C      . midazolam (VERSED) 2 MG/ML syrup 8 mg  8 mg Oral Once PRN Shaina Gullatt, Ranae Plumber, PA-C      . midazolam (VERSED) injection    PRN Annice Needy, MD   2 mg at 01/01/20 1000  . [MAR Hold] ondansetron (ZOFRAN) injection 4 mg  4 mg Intravenous Q6H PRN Gryphon Vanderveen, Ranae Plumber, PA-C       Past Medical History:  Diagnosis Date  . DVT (deep venous thrombosis) (HCC)    History reviewed. No pertinent surgical history.  Social History Social History   Tobacco Use  . Smoking status: Current Some Day Smoker  .  Smokeless tobacco: Current User  Substance Use Topics  . Alcohol use: Yes  . Drug use: Not on file   Family History History reviewed. No pertinent family history.  Denies family history of peripheral artery disease, venous disease or bleeding/clotting disorders.  No Known Allergies  REVIEW OF SYSTEMS (Negative unless checked)  Constitutional: [] Weight loss  [] Fever  [] Chills Cardiac: [] Chest pain   [] Chest pressure   [] Palpitations   [] Shortness of breath when laying flat   [] Shortness of breath at rest   [] Shortness of breath with exertion. Vascular:  [x] Pain in legs with walking   [x] Pain in legs at rest   [x] Pain in legs when laying flat   [] Claudication   [] Pain in feet when walking  [] Pain in feet at rest  [] Pain in feet when laying flat    [x] History of DVT   [] Phlebitis   [] Swelling in legs   [] Varicose veins   [] Non-healing ulcers Pulmonary:   [] Uses home oxygen   [] Productive cough   [] Hemoptysis   [] Wheeze  [] COPD   [] Asthma Neurologic:  [] Dizziness  [] Blackouts   [] Seizures   [] History of stroke   [] History of TIA  [] Aphasia   [] Temporary blindness   [] Dysphagia   [] Weakness or numbness in arms   [] Weakness or numbness in legs Musculoskeletal:  [] Arthritis   [] Joint swelling   [] Joint pain   [] Low back pain Hematologic:  [] Easy bruising  [] Easy bleeding   [] Hypercoagulable state   [] Anemic  [] Hepatitis Gastrointestinal:  [] Blood in stool   [] Vomiting blood  [] Gastroesophageal reflux/heartburn   [] Difficulty swallowing. Genitourinary:  [] Chronic kidney disease   [] Difficult urination  [] Frequent urination  [] Burning with urination   [] Blood in urine Skin:  [] Rashes   [] Ulcers   [] Wounds Psychological:  [] History of anxiety   []  History of major depression.  Physical Examination  Vitals:   01/01/20 0947 01/01/20 1000 01/01/20 1005 01/01/20 1010  BP: 138/71 (!) 121/57 117/60 117/63  Pulse: (!) 116     Resp: (!) 28 15 (!) 23 (!) 22  Temp: 98.5 F (36.9 C)     TempSrc: Oral     SpO2: 100% 100% 99% 97%  Weight:      Height:       Body mass index is 41.9 kg/m. Gen:  WD/WN, NAD Head: [place]/AT, No temporalis wasting. Prominent temp pulse not noted. Ear/Nose/Throat: Hearing grossly intact, nares w/o erythema or drainage, oropharynx w/o Erythema/Exudate Eyes: Sclera non-icteric, conjunctiva clear Neck: Trachea midline.  No JVD.  Pulmonary:  Good air movement, respirations not labored, equal bilaterally.  Cardiac: RRR, normal S1, S2. Vascular:  Vessel Right Left  Radial Palpable Palpable  Ulnar Palpable Palpable  Brachial Palpable Palpable  Carotid Palpable, without bruit Palpable, without bruit  Aorta Not palpable N/A  Femoral Palpable Palpable  Popliteal Palpable Palpable  PT Palpable Palpable  DP Palpable Palpable    Left lower extremity: Thigh soft.  Calf soft.  Mild to moderate edema noted to left lower extremity.  There is no acute vascular compromise noted to extremity at this time.  Motor/sensory is intact.  Gastrointestinal: soft, non-tender/non-distended. No guarding/reflex.  Musculoskeletal: M/S 5/5 throughout.  Extremities without ischemic changes.  No deformity or atrophy. No edema. Neurologic: Sensation grossly intact in extremities.  Symmetrical.  Speech is fluent. Motor exam as listed above. Psychiatric: Judgment intact, Mood & affect appropriate for pt's clinical situation. Dermatologic: No rashes or ulcers noted.  No cellulitis or open wounds. Lymph : No Cervical, Axillary, or Inguinal  lymphadenopathy.  CBC Lab Results  Component Value Date   WBC 11.5 (H) 01/01/2020   HGB 9.1 (L) 01/01/2020   HCT 29.5 (L) 01/01/2020   MCV 76.4 (L) 01/01/2020   PLT 142 (L) 01/01/2020   BMET    Component Value Date/Time   NA 138 01/01/2020 0146   K 3.9 01/01/2020 0146   CL 106 01/01/2020 0146   CO2 23 01/01/2020 0146   GLUCOSE 107 (H) 01/01/2020 0146   BUN 12 01/01/2020 0146   CREATININE 0.58 01/01/2020 0146   CALCIUM 8.5 (L) 01/01/2020 0146   GFRNONAA >60 01/01/2020 0146   GFRAA >60 01/01/2020 0146   Estimated Creatinine Clearance: 135.6 mL/min (by C-G formula based on SCr of 0.58 mg/dL).  COAG Lab Results  Component Value Date   INR 1.1 12/31/2019   Radiology CT ANGIO CHEST PE W OR WO CONTRAST  Result Date: 12/31/2019 CLINICAL DATA:  24 year old female with concern for pulmonary embolism. EXAM: CT ANGIOGRAPHY CHEST WITH CONTRAST TECHNIQUE: Multidetector CT imaging of the chest was performed using the standard protocol during bolus administration of intravenous contrast. Multiplanar CT image reconstructions and MIPs were obtained to evaluate the vascular anatomy. CONTRAST:  26mL OMNIPAQUE IOHEXOL 350 MG/ML SOLN COMPARISON:  None. FINDINGS: Cardiovascular: There is no cardiomegaly or  pericardial effusion. The thoracic aorta is unremarkable. The origins of the great vessels of the aortic arch appear patent. Evaluation of the pulmonary arteries is very limited due to suboptimal opacification and timing of the contrast. There is however intraluminal filling defects noted involving the left lower lobe lobar and segmental branches consistent with pulmonary emboli. No CT evidence of right heart straining. Mediastinum/Nodes: No hilar or mediastinal adenopathy. The esophagus and the thyroid gland are grossly unremarkable. No mediastinal fluid collection. Lungs/Pleura: Minimal right lung base atelectasis. No focal consolidation, or pneumothorax. Probable trace right pleural effusion. There is a 4 mm right upper lobe nodule (36/6), likely a partially calcified granuloma. The central airways are patent. Upper Abdomen: No acute abnormality. Musculoskeletal: No chest wall abnormality. No acute or significant osseous findings. Review of the MIP images confirms the above findings. IMPRESSION: Left lower lobe pulmonary emboli. No CT evidence of right heart straining. These results were called by telephone at the time of interpretation on 12/31/2019 at 8:37 pm to provider Manuela Schwartz, who verbally acknowledged these results. Electronically Signed   By: Elgie Collard M.D.   On: 12/31/2019 20:42   US Venous Img Lower Unilateral Left  Result Date: 12/31/2019 CLINICAL DATA:  PE. EXAM: LEFT LOWER EXTREMITY VENOUS DOPPLER ULTRASOUND TECHNIQUE: Gray-scale sonography with compression, as well as color and duplex ultrasound, were performed to evaluate the deep venous system(s) from the level of the common femoral vein through the popliteal and proximal calf veins. COMPARISON:  None. FINDINGS: VENOUS There is extensive occlusive thrombus throughout the left lower extremity including the left CFV, profundus femoral, superficial femoral vein, popliteal vein, and tibial veins. Limited views of the contralateral  common femoral vein are unremarkable. OTHER Lower extremity edema is noted. Limitations: none IMPRESSION: Extensive occlusive thrombus is noted involving essentially all of the deep veins of the left lower extremity. These results were called by telephone at the time of interpretation on 12/31/2019 at 5:15 pm to provider JENISE MENSHEW , who verbally acknowledged these results. Electronically Signed   By: Katherine Mantle M.D.   On: 12/31/2019 17:18   Assessment/Plan The patient is a 24 year old female who presented to the [facility] emergency department with  progressively worsening left lower extremity pain and swelling.  Patient found to have extensive DVT to the left lower extremity as well as a small left lobe PE  1.  Extensive left lower extremity DVT: Patient with remote history of PE in the past.  Was on anticoagulation however unfortunately had to stop when she lost her insurance benefits.  Presents today with extensive left lower extremity DVT.  An attempt to revascularize the extremity, decrease the clot burden, improve the patient's pain and discomfort, and decrease postphlebitic complications recommend undergoing a left lower extremity thrombolysis and thrombectomy.  Procedure, risks and benefits were explained to the patient.  All questions were answered.  The patient wished to proceed.  2.  Pulmonary embolism: Reviewed images with Dr. Wyn Quakerew.  Patient with small clot burden to lower left lobe.  No heart strain on CT.  Patient does not complain of any shortness of breath or chest pain.  No indication for pulmonary intervention at this time.  Recommend anticoagulation with Eliquis for at least 1 year.  3.  Anticoagulation: Had a conversation in regard to the need for anticoagulation for at least a year in the setting of DVT/PE.  I recommendation is Eliquis 5mg  PO twice daily.  Possibly consult social work for assistance with medications upon discharge?  Discussed with  Dr. Weldon Inchesew   Jadalee Westcott A Benjamin Casanas, PA-C  01/01/2020 10:12 AM  This note was created with Dragon medical transcription system.  Any error is purely unintentional.

## 2020-01-01 NOTE — Progress Notes (Signed)
Pt. States "I feel better now." BP 90's, HR 90's. States nausea gone 100%. Pt. Able to wiggle toes now and move foot. Left popliteal site clean, dry, intact without complications

## 2020-01-01 NOTE — Progress Notes (Signed)
Pt. States her calf "burning" now. States "I can't feel my toes at all. I cant't move my toes now where I could before." K. Stegmayor, PA in to assess pt.

## 2020-01-01 NOTE — Progress Notes (Signed)
PROGRESS NOTE    Caroline Barry  KGY:[id number] DOB: Jun 22, 1995 DOA: 12/31/2019 PCP: Patient, No Pcp Per  Brief Narrative:  HPI: Caroline Barry is a 24 y.o. female with medical history significant for PE, central venous thrombosis, migraine, tobacco use who presents with concern of left leg edema and increased pain.  Patient says that for the past 2 days she has noticed increased asymmetric swelling of her left lower leg.  Also began to notice painful cramps from her hip down to her knees.  Also from knee down to her feet she feels numbness and tingling.  She has a hard time walking due to the pain behind her knees.  Patient uses tobacco and smokes cigars.  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  She had history of PE last year and a central venous thrombus about 2 years ago.  She was never told the reasoning for this.  She was started on Eliquis but stopped due to financial constraints.  Now she only takes aspirin occasionally.  She denies any chest pain or shortness of breath which she has felt before during her prior PE.  She denies any use of hormonal birth control.  Denies any family history of clotting disorders.  8/12: Patient seen and examined.  Hemodynamically stable.  Complains of pain in left leg.  Communicated with Dr. Wyn Quaker.  Plan for mechanical thrombectomy today.   Assessment & Plan:   Principal Problem:   DVT (deep venous thrombosis) (HCC) Active Problems:   Pulmonary embolism (HCC)   Microcytic anemia   Thrombocytopenia (HCC)  DVT of the left lower extremity Ultrasound shows DVT of all deep veins in the left lower extremity Dr. Wyn Quaker, vascular surgery on consult Mechanical thrombectomy today 1221 Plan: Continue IV heparin infusion N.p.o. for procedure As needed pain control Anticipate patient will need long-term anticoagulation Case management aware.  Patient was previously on Eliquis but stopped due to  financial constraints  Left lower lobe pulmonary embolism No evidence of right heart strain Continue IV heparin infusion  Defer echocardiogram for now  Microcytic anemia Stable and last hemoglobin a year ago was about 9 Will check iron panel Suspect chronic iron deficiency anemia No indication for transfusion currently  Thrombocytopenia Likely secondary to significant DVT and PE.  Monitor while on IV heparin infusion.  Tobacco use Discussed cessation especially with her significant history of DVTs and PE    DVT prophylaxis: Heparin GTT Code Status: Full Family Communication: None today Disposition Plan: Status is: Inpatient  Remains inpatient appropriate because:Inpatient level of care appropriate due to severity of illness   Dispo: The patient is from: Home              Anticipated d/c is to: Home              Anticipated d/c date is: 3 days              Patient currently is not medically stable to d/c.         Consultants:   Vascular surgery  Procedures:   Mechanical thrombectomy, 01/01/20  Antimicrobials:   None   Subjective: Seen and examined.  Complains of pain in left leg.  No other complaints.  Objective: Vitals:   01/01/20 1215 01/01/20 1218 01/01/20 1220 01/01/20 1225  BP: 105/82 93/70  107/68  Pulse: 92 94 94 91  Resp: (!) 22 19 17  (!) 22  Temp:      TempSrc:  SpO2: 99%  100% 100%  Weight:      Height:        Intake/Output Summary (Last 24 hours) at 01/01/2020 1313 Last data filed at 01/01/2020 0300 Gross per 24 hour  Intake 95.04 ml  Output --  Net 95.04 ml   Filed Weights   12/31/19 1425 12/31/19 2124  Weight: 63 kg 114.2 kg    Examination:  General exam: Appears calm and comfortable  Respiratory system: Clear to auscultation. Respiratory effort normal. Cardiovascular system: S1 & S2 heard, RRR. No JVD, murmurs, rubs, gallops or clicks. No pedal edema. Gastrointestinal system: Abdomen is nondistended, soft and  nontender. No organomegaly or masses felt. Normal bowel sounds heard. Central nervous system: Alert and oriented. No focal neurological deficits. Extremities: Left leg swollen, tender to touch, tense.  Limited range of motion Skin: No rashes, lesions or ulcers Psychiatry: Judgement and insight appear normal. Mood & affect appropriate.     Data Reviewed: I have personally reviewed following labs and imaging studies  CBC: Recent Labs  Lab 12/31/19 1602 01/01/20 0146 01/01/20 1127  WBC 14.2* 11.5* 7.0  NEUTROABS 10.3*  --   --   HGB 9.8* 9.1* 8.3*  HCT 31.9* 29.5* 28.6*  MCV 77.6* 76.4* 80.6  PLT 120* 142* 125*   Basic Metabolic Panel: Recent Labs  Lab 12/31/19 1602 01/01/20 0146  NA 139 138  K 3.7 3.9  CL 102 106  CO2 25 23  GLUCOSE 110* 107*  BUN 12 12  CREATININE 0.79 0.58  CALCIUM 9.0 8.5*   GFR: Estimated Creatinine Clearance: 135.6 mL/min (by C-G formula based on SCr of 0.58 mg/dL). Liver Function Tests: Recent Labs  Lab 12/31/19 1602  AST 17  ALT 14  ALKPHOS 63  BILITOT 0.3  PROT 8.1  ALBUMIN 4.2   No results for input(s): LIPASE, AMYLASE in the last 168 hours. No results for input(s): AMMONIA in the last 168 hours. Coagulation Profile: Recent Labs  Lab 12/31/19 1602  INR 1.1   Cardiac Enzymes: No results for input(s): CKTOTAL, CKMB, CKMBINDEX, TROPONINI in the last 168 hours. BNP (last 3 results) No results for input(s): PROBNP in the last 8760 hours. HbA1C: No results for input(s): HGBA1C in the last 72 hours. CBG: No results for input(s): GLUCAP in the last 168 hours. Lipid Profile: No results for input(s): CHOL, HDL, LDLCALC, TRIG, CHOLHDL, LDLDIRECT in the last 72 hours. Thyroid Function Tests: No results for input(s): TSH, T4TOTAL, FREET4, T3FREE, THYROIDAB in the last 72 hours. Anemia Panel: Recent Labs    01/01/20 0146  TIBC 386  IRON 10*   Sepsis Labs: Recent Labs  Lab 12/31/19 1602 12/31/19 2141  LATICACIDVEN 1.3 2.0*     Recent Results (from the past 240 hour(s))  SARS Coronavirus 2 by RT PCR (hospital order, performed in [facility] hospital lab) Nasopharyngeal Nasopharyngeal Swab     Status: None   Collection Time: 12/31/19  5:57 PM   Specimen: Nasopharyngeal Swab  Result Value Ref Range Status   SARS Coronavirus 2 NEGATIVE NEGATIVE Final    Comment: (NOTE) SARS-CoV-2 target nucleic acids are NOT DETECTED.  The SARS-CoV-2 RNA is generally detectable in upper and lower respiratory specimens during the acute phase of infection. The lowest concentration of SARS-CoV-2 viral copies this assay can detect is 250 copies / mL. A negative result does not preclude SARS-CoV-2 infection and should not be used as the sole basis for treatment or other patient management decisions.  A negative result may occur  with improper specimen collection / handling, submission of specimen other than nasopharyngeal swab, presence of viral mutation(s) within the areas targeted by this assay, and inadequate number of viral copies (<250 copies / mL). A negative result must be combined with clinical observations, patient history, and epidemiological information.  Fact Sheet for Patients:   BoilerBrush.com.cy  Fact Sheet for Healthcare Providers: https://pope.com/  This test is not yet approved or  cleared by the [place] FDA and has been authorized for detection and/or diagnosis of SARS-CoV-2 by FDA under an Emergency Use Authorization (EUA).  This EUA will remain in effect (meaning this test can be used) for the duration of the COVID-19 declaration under Section 564(b)(1) of the Act, 21 U.S.C. section 360bbb-3(b)(1), unless the authorization is terminated or revoked sooner.  Performed at [facility], [address] [place], [place] [address]          Radiology Studies: CT ANGIO CHEST PE W OR WO CONTRAST  Result Date: 12/31/2019 CLINICAL DATA:   24 year old female with concern for pulmonary embolism. EXAM: CT ANGIOGRAPHY CHEST WITH CONTRAST TECHNIQUE: Multidetector CT imaging of the chest was performed using the standard protocol during bolus administration of intravenous contrast. Multiplanar CT image reconstructions and MIPs were obtained to evaluate the vascular anatomy. CONTRAST:  63mL OMNIPAQUE IOHEXOL 350 MG/ML SOLN COMPARISON:  None. FINDINGS: Cardiovascular: There is no cardiomegaly or pericardial effusion. The thoracic aorta is unremarkable. The origins of the great vessels of the aortic arch appear patent. Evaluation of the pulmonary arteries is very limited due to suboptimal opacification and timing of the contrast. There is however intraluminal filling defects noted involving the left lower lobe lobar and segmental branches consistent with pulmonary emboli. No CT evidence of right heart straining. Mediastinum/Nodes: No hilar or mediastinal adenopathy. The esophagus and the thyroid gland are grossly unremarkable. No mediastinal fluid collection. Lungs/Pleura: Minimal right lung base atelectasis. No focal consolidation, or pneumothorax. Probable trace right pleural effusion. There is a 4 mm right upper lobe nodule (36/6), likely a partially calcified granuloma. The central airways are patent. Upper Abdomen: No acute abnormality. Musculoskeletal: No chest wall abnormality. No acute or significant osseous findings. Review of the MIP images confirms the above findings. IMPRESSION: Left lower lobe pulmonary emboli. No CT evidence of right heart straining. These results were called by telephone at the time of interpretation on 12/31/2019 at 8:37 pm to provider Manuela Schwartz, who verbally acknowledged these results. Electronically Signed   By: Elgie Collard M.D.   On: 12/31/2019 20:42   PERIPHERAL VASCULAR CATHETERIZATION  Result Date: 01/01/2020 See op note  US Venous Img Lower Unilateral Left  Result Date: 12/31/2019 CLINICAL DATA:  PE.  EXAM: LEFT LOWER EXTREMITY VENOUS DOPPLER ULTRASOUND TECHNIQUE: Gray-scale sonography with compression, as well as color and duplex ultrasound, were performed to evaluate the deep venous system(s) from the level of the common femoral vein through the popliteal and proximal calf veins. COMPARISON:  None. FINDINGS: VENOUS There is extensive occlusive thrombus throughout the left lower extremity including the left CFV, profundus femoral, superficial femoral vein, popliteal vein, and tibial veins. Limited views of the contralateral common femoral vein are unremarkable. OTHER Lower extremity edema is noted. Limitations: none IMPRESSION: Extensive occlusive thrombus is noted involving essentially all of the deep veins of the left lower extremity. These results were called by telephone at the time of interpretation on 12/31/2019 at 5:15 pm to provider JENISE MENSHEW , who verbally acknowledged these results. Electronically Signed   By: Cristal Deer  Green M.D.   On: 12/31/2019 17:18        Scheduled Meds: . fentaNYL      . fentaNYL      . midazolam      . ondansetron       Continuous Infusions: . sodium chloride    . sodium chloride    . ceFAZolin    . heparin Stopped (01/01/20 0930)  . phenylephrine (NEO-SYNEPHRINE) Adult infusion 20 mcg/min (01/01/20 1203)     LOS: 0 days    Time spent: 25 minutes    Tresa MooreSudheer B Shawntay Prest, MD [facility] Pager [phone]  If 7PM-7AM, please contact night-coverage 01/01/2020, 1:13 PM

## 2020-01-01 NOTE — Progress Notes (Signed)
ANTICOAGULATION CONSULT NOTE  Pharmacy Consult for heparin Indication: DVT  No Known Allergies  Patient Measurements: Height: 5\' 5"  (165.1 cm) Weight: 114.2 kg (251 lb 12.8 oz) IBW/kg (Calculated) : 57 Heparin Dosing Weight: 63 kg  Vital Signs: Temp: 98.4 F (36.9 C) (08/12 1600) Temp Source: Oral (08/12 1600) BP: 120/71 (08/12 1800) Pulse Rate: 97 (08/12 1800)  Labs: Recent Labs    12/31/19 1602 12/31/19 1602 12/31/19 2141 01/01/20 0146 01/01/20 0146 01/01/20 0758 01/01/20 1127 01/01/20 1523 01/01/20 2055  HGB 9.8*   < >  --  9.1*   < >  --  8.3* 8.7*  --   HCT 31.9*   < >  --  29.5*  --   --  28.6* 27.1*  --   PLT 120*   < >  --  142*  --   --  125* 72*  --   APTT  --   --  95*  --   --   --   --   --   --   LABPROT 13.6  --   --   --   --   --   --   --   --   INR 1.1  --   --   --   --   --   --   --   --   HEPARINUNFRC  --   --   --  0.36  --  0.30  --   --  0.17*  CREATININE 0.79  --   --  0.58  --   --   --   --   --    < > = values in this interval not displayed.    Estimated Creatinine Clearance: 135.6 mL/min (by C-G formula based on SCr of 0.58 mg/dL).   Medical History: Past Medical History:  Diagnosis Date  . DVT (deep venous thrombosis) (HCC)   . Pulmonary embolus (HCC) 2020    Assessment: 24 year old female presented with leg pain. [place] H/o PE and cerebral venous thrombosis, previously on Eliquis but has not taken for approximately one year per ED note. EDP discussed with Vascular and it appears current plan is for thrombectomy. Patient to start on heparin drip in the interim.  Goal of Therapy:  Heparin level 0.3-0.7 units/ml Monitor platelets by anticoagulation protocol: Yes   Plan:  08/12 @ 2100 HL 0.17 subtherapeutic. Will increase rate to 1200 units/hr and will recheck HL at 0400, plts have notably decreased, will monitor closely.  10/12, PharmD 01/01/2020,10:36 PM

## 2020-01-01 NOTE — Progress Notes (Signed)
ANTICOAGULATION CONSULT NOTE  Pharmacy Consult for heparin Indication: DVT  No Known Allergies  Patient Measurements: Height: 5\' 5"  (165.1 cm) Weight: 114.2 kg (251 lb 12.8 oz) IBW/kg (Calculated) : 57 Heparin Dosing Weight: 63 kg  Vital Signs: Temp: 98.4 F (36.9 C) (08/12 0557) Temp Source: Oral (08/12 0557) BP: 116/72 (08/12 0557) Pulse Rate: 108 (08/12 0557)  Labs: Recent Labs    12/31/19 1602 12/31/19 2141 01/01/20 0146 01/01/20 0758  HGB 9.8*  --  9.1*  --   HCT 31.9*  --  29.5*  --   PLT 120*  --  142*  --   APTT  --  95*  --   --   LABPROT 13.6  --   --   --   INR 1.1  --   --   --   HEPARINUNFRC  --   --  0.36 0.30  CREATININE 0.79  --  0.58  --     Estimated Creatinine Clearance: 135.6 mL/min (by C-G formula based on SCr of 0.58 mg/dL).   Medical History: Past Medical History:  Diagnosis Date  . DVT (deep venous thrombosis) [facility])     Assessment: 24 year old female presented with leg pain. [place] H/o PE and cerebral venous thrombosis, previously on Eliquis but has not taken for approximately one year per ED note. EDP discussed with Vascular and it appears current plan is for thrombectomy. Patient to start on heparin drip in the interim.  [employer]al [employer] @ 0200 HL 0.36 therapeutic x 1 @ 1000 units/hr 0812 @ 0758 HL 0.30 therapeutic x 2  Goal of Therapy:  Heparin level 0.3-0.7 units/ml Monitor platelets by anticoagulation protocol: Yes   Plan:  Heparin level borderline with slight downward trend - will slightly increase current rate to 1050 units/hr and will recheck HL in 6 hours per protocol.  CBC's daily per protocol.  [id number], PharmD, BCPS Clinical Pharmacist 01/01/2020 9:00 AM

## 2020-01-01 NOTE — Progress Notes (Signed)
ANTICOAGULATION CONSULT NOTE  Pharmacy Consult for heparin Indication: DVT  No Known Allergies  Patient Measurements: Height: 5\' 5"  (165.1 cm) Weight: 114.2 kg (251 lb 12.8 oz) IBW/kg (Calculated) : 57 Heparin Dosing Weight: 63 kg  Vital Signs: Temp: 98.2 F (36.8 C) (08/12 0122) Temp Source: Oral (08/12 0122) BP: 127/84 (08/12 0122) Pulse Rate: 100 (08/12 0122)  Labs: Recent Labs    12/31/19 1602 12/31/19 2141 01/01/20 0146  HGB 9.8*  --  9.1*  HCT 31.9*  --  29.5*  PLT 120*  --  142*  APTT  --  95*  --   LABPROT 13.6  --   --   INR 1.1  --   --   HEPARINUNFRC  --   --  0.36  CREATININE 0.79  --  0.58    Estimated Creatinine Clearance: 135.6 mL/min (by C-G formula based on SCr of 0.58 mg/dL).   Medical History: Past Medical History:  Diagnosis Date  . DVT (deep venous thrombosis) [facility])     Assessment: 24 year old female presented with leg pain. [place] H/o PE and cerebral venous thrombosis, previously on Eliquis but has not taken for approximately one year per ED note. EDP discussed with Vascular and it appears current plan is for thrombectomy. Patient to start on heparin drip in the interim.  Goal of Therapy:  Heparin level 0.3-0.7 units/ml Monitor platelets by anticoagulation protocol: Yes   Plan:  08/12 @ 0200 HL 0.36 therapeutic. Will continue current rate and will recheck HL at 0800 and continue to monitor.  10/12, PharmD 01/01/2020,5:06 AM

## 2020-01-02 LAB — CBC WITH DIFFERENTIAL/PLATELET
Abs Immature Granulocytes: 0.05 10*3/uL (ref 0.00–0.07)
Basophils Absolute: 0 10*3/uL (ref 0.0–0.1)
Basophils Relative: 0 %
Eosinophils Absolute: 0.2 10*3/uL (ref 0.0–0.5)
Eosinophils Relative: 2 %
HCT: 24.5 % — ABNORMAL LOW (ref 36.0–46.0)
Hemoglobin: 7.2 g/dL — ABNORMAL LOW (ref 12.0–15.0)
Immature Granulocytes: 1 %
Lymphocytes Relative: 24 %
Lymphs Abs: 2.4 10*3/uL (ref 0.7–4.0)
MCH: 23.7 pg — ABNORMAL LOW (ref 26.0–34.0)
MCHC: 29.4 g/dL — ABNORMAL LOW (ref 30.0–36.0)
MCV: 80.6 fL (ref 80.0–100.0)
Monocytes Absolute: 0.6 10*3/uL (ref 0.1–1.0)
Monocytes Relative: 6 %
Neutro Abs: 6.7 10*3/uL (ref 1.7–7.7)
Neutrophils Relative %: 67 %
Platelets: 69 10*3/uL — ABNORMAL LOW (ref 150–400)
RBC: 3.04 MIL/uL — ABNORMAL LOW (ref 3.87–5.11)
RDW: 18.4 % — ABNORMAL HIGH (ref 11.5–15.5)
WBC: 10 10*3/uL (ref 4.0–10.5)
nRBC: 0 % (ref 0.0–0.2)

## 2020-01-02 LAB — BASIC METABOLIC PANEL
Anion gap: 9 (ref 5–15)
BUN: 11 mg/dL (ref 6–20)
CO2: 25 mmol/L (ref 22–32)
Calcium: 8.5 mg/dL — ABNORMAL LOW (ref 8.9–10.3)
Chloride: 108 mmol/L (ref 98–111)
Creatinine, Ser: 0.57 mg/dL (ref 0.44–1.00)
GFR calc Af Amer: >60 mL/min (ref >60)
GFR calc non Af Amer: >60 mL/min (ref >60)
Glucose, Bld: 99 mg/dL (ref 70–99)
Potassium: 4.4 mmol/L (ref 3.5–5.1)
Sodium: 142 mmol/L (ref 135–145)

## 2020-01-02 LAB — HEPARIN LEVEL (UNFRACTIONATED): Heparin Unfractionated: 0.45 IU/mL (ref 0.30–0.70)

## 2020-01-02 MED ORDER — MORPHINE SULFATE (PF) 2 MG/ML IV SOLN
2.0000 mg | INTRAVENOUS | Status: DC | PRN
  Administered 2020-01-02 – 2020-01-03 (×2): 2 mg via INTRAVENOUS
  Filled 2020-01-02 (×2): qty 1

## 2020-01-02 MED ORDER — APIXABAN 5 MG PO TABS
10.0000 mg | ORAL_TABLET | Freq: Two times a day (BID) | ORAL | Status: DC
  Administered 2020-01-02 – 2020-01-06 (×9): 10 mg via ORAL
  Filled 2020-01-02 (×9): qty 2

## 2020-01-02 MED ORDER — HYDROCODONE-ACETAMINOPHEN 5-325 MG PO TABS
1.0000 | ORAL_TABLET | Freq: Four times a day (QID) | ORAL | Status: DC | PRN
  Administered 2020-01-02: 1 via ORAL
  Filled 2020-01-02: qty 1

## 2020-01-02 MED ORDER — HYDROCODONE-ACETAMINOPHEN 5-325 MG PO TABS
1.0000 | ORAL_TABLET | ORAL | Status: DC | PRN
  Administered 2020-01-03 – 2020-01-05 (×2): 1 via ORAL
  Filled 2020-01-02 (×2): qty 1

## 2020-01-02 MED ORDER — APIXABAN 5 MG PO TABS: 5.0000 mg | ORAL_TABLET | Freq: Two times a day (BID) | ORAL | Status: DC

## 2020-01-02 NOTE — Progress Notes (Signed)
   01/02/20 1100  Clinical Encounter Type  Visited With Patient  Visit Type Initial  Referral From Chaplain  Consult/Referral To Chaplain  While rounding ICU, chaplain stopped in to talk with patient. Patient said she was better since surgery. 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 Patient said she loves her job. She gets to travel and meets a lot of people. Patient also said that she has family over and driving the truck, affords her the opportunity to see family that she would not normally get to see. Chaplain told patient that her husband is also a IT trainer. Patient told chaplain that she was being transferred to the floor later. Chaplain told patient that she would her and check on her later.

## 2020-01-02 NOTE — Progress Notes (Signed)
PROGRESS NOTE    Caroline Barry  ZOX:[id number] DOB: 06-Sep-1995 DOA: 12/31/2019 PCP: Patient, No Pcp Per  Brief Narrative:  HPI: Caroline BillingsShambrielle Barry is a 24 y.o. female with medical history significant for PE, central venous thrombosis, migraine, tobacco use who presents with concern of left leg edema and increased pain.  Patient says that for the past 2 days she has noticed increased asymmetric swelling of her left lower leg.  Also began to notice painful cramps from her hip down to her knees.  Also from knee down to her feet she feels numbness and tingling.  She has a hard time walking due to the pain behind her knees.  Patient uses tobacco and smokes cigars.  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  She had history of PE last year and a central venous thrombus about 2 years ago.  She was never told the reasoning for this.  She was started on Eliquis but stopped due to financial constraints.  Now she only takes aspirin occasionally.  She denies any chest pain or shortness of breath which she has felt before during her prior PE.  She denies any use of hormonal birth control.  Denies any family history of clotting disorders.  8/12: Patient seen and examined.  Hemodynamically stable.  Complains of pain in left leg.  Communicated with Dr. Wyn Quakerew.  Plan for mechanical thrombectomy today.  8/13: Patient seen and examined.  Status post mechanical thrombectomy with vascular surgery yesterday.  Also underwent catheter directed thrombolysis given extensive clot burden of left lower extremity.  This morning left leg discomfort improved.  Postoperatively required admission to the intensive care unit and short period of time on vasopressor support for hypotension.  Now recovered and off all pressors.  Stable for discharge out of ICU   Assessment & Plan:   Principal Problem:   DVT (deep venous thrombosis) (HCC) Active Problems:   Pulmonary embolism  (HCC)   Microcytic anemia   Thrombocytopenia (HCC)  DVT of the left lower extremity Ultrasound shows DVT of all deep veins in the left lower extremity Dr. Wyn Quakerew, vascular surgery on consult Mechanical thrombectomy 8/12 Required short ICU stay for vasopressor support postoperatively Stable for transfer back to MedSurg Plan: IV heparin discontinued per vascular Eliquis started As needed pain control Anticipate patient will need long-term anticoagulation Case management aware.  Patient was previously on Eliquis but stopped due to financial constraints  Left lower lobe pulmonary embolism No evidence of right heart strain P.o. Eliquis initiated as above  Defer echocardiogram for now  Microcytic anemia Stable and last hemoglobin a year ago was about 9 Will check iron panel Suspect chronic iron deficiency anemia No indication for transfusion currently  Thrombocytopenia Likely secondary to significant DVT and PE.   Relatively stable No bleeding noted Transition to Eliquis Check daily CBC  Tobacco use Discussed cessation especially with her significant history of DVTs and PE    DVT prophylaxis: Eliquis Code Status: Full Family Communication: None today Disposition Plan: Status is: Inpatient  Remains inpatient appropriate because:Inpatient level of care appropriate due to severity of illness   Dispo: The patient is from: Home              Anticipated d/c is to: Home              Anticipated d/c date is: 2 days              Patient currently is not  medically stable to d/c.   Transferring out of ICU today.  Transitioning to p.o. Eliquis today.  Patient remained stable and pain in left lower extremity improves anticipate discharge within 48 hours.  TOC will need to determine how we can get this patient oral anticoagulation at home.  Consultants:   Vascular surgery  Procedures:   Mechanical thrombectomy, 01/01/20  Antimicrobials:   None   Subjective: Seen and  examined.  Status post vascular intervention.  Complains of some pain in left leg this morning.  Improved over interval  Objective: Vitals:   01/02/20 0000 01/02/20 0100 01/02/20 0200 01/02/20 0300  BP: (!) 109/57 (!) 117/53 (!) 111/54 108/62  Pulse: 100 (!) 101 99 93  Resp: (!) 23 (!) 21 (!) 21 19  Temp: 99.1 F (37.3 C)     TempSrc: Oral     SpO2: 96% 96% 93% 98%  Weight:      Height:        Intake/Output Summary (Last 24 hours) at 01/02/2020 1112 Last data filed at 01/02/2020 0426 Gross per 24 hour  Intake 470.87 ml  Output 950 ml  Net -479.13 ml   Filed Weights   12/31/19 1425 12/31/19 2124  Weight: 63 kg 114.2 kg    Examination:  General: No apparent distress, patient appears well HEENT: Normocephalic, atraumatic Neck, supple, trachea midline, no tenderness Heart: Regular rate and rhythm, S1/S2 normal, no murmurs Lungs: Clear to auscultation bilaterally, no adventitious sounds, normal work of breathing Abdomen: Obese, soft, nontender, nondistended, positive bowel sounds Extremities: Left lower extremity swollen, tender to touch, decreased range of motion Skin: No rashes or lesions, normal color Neurologic: Cranial nerves grossly intact, sensation intact, alert and oriented x3 Psychiatric: Normal affect      Data Reviewed: I have personally reviewed following labs and imaging studies  CBC: Recent Labs  Lab 12/31/19 1602 01/01/20 0146 01/01/20 1127 01/01/20 1523 01/02/20 0704  WBC 14.2* 11.5* 7.0 17.9* 10.0  NEUTROABS 10.3*  --   --   --  6.7  HGB 9.8* 9.1* 8.3* 8.7* 7.2*  HCT 31.9* 29.5* 28.6* 27.1* 24.5*  MCV 77.6* 76.4* 80.6 75.7* 80.6  PLT 120* 142* 125* 72* 69*   Basic Metabolic Panel: Recent Labs  Lab 12/31/19 1602 01/01/20 0146 01/02/20 0704  NA 139 138 142  K 3.7 3.9 4.4  CL 102 106 108  CO2 25 23 25   GLUCOSE 110* 107* 99  BUN 12 12 11   CREATININE 0.79 0.58 0.57  CALCIUM 9.0 8.5* 8.5*   GFR: Estimated Creatinine Clearance: 135.6  mL/min (by C-G formula based on SCr of 0.57 mg/dL). Liver Function Tests: Recent Labs  Lab 12/31/19 1602  AST 17  ALT 14  ALKPHOS 63  BILITOT 0.3  PROT 8.1  ALBUMIN 4.2   No results for input(s): LIPASE, AMYLASE in the last 168 hours. No results for input(s): AMMONIA in the last 168 hours. Coagulation Profile: Recent Labs  Lab 12/31/19 1602  INR 1.1   Cardiac Enzymes: No results for input(s): CKTOTAL, CKMB, CKMBINDEX, TROPONINI in the last 168 hours. BNP (last 3 results) No results for input(s): PROBNP in the last 8760 hours. HbA1C: No results for input(s): HGBA1C in the last 72 hours. CBG: No results for input(s): GLUCAP in the last 168 hours. Lipid Profile: No results for input(s): CHOL, HDL, LDLCALC, TRIG, CHOLHDL, LDLDIRECT in the last 72 hours. Thyroid Function Tests: No results for input(s): TSH, T4TOTAL, FREET4, T3FREE, THYROIDAB in the last 72 hours. Anemia Panel: Recent  Labs    01/01/20 0146  TIBC 386  IRON 10*   Sepsis Labs: Recent Labs  Lab 12/31/19 1602 12/31/19 2141  LATICACIDVEN 1.3 2.0*    Recent Results (from the past 240 hour(s))  SARS Coronavirus 2 by RT PCR (hospital order, performed in [facility] hospital lab) Nasopharyngeal Nasopharyngeal Swab     Status: None   Collection Time: 12/31/19  5:57 PM   Specimen: Nasopharyngeal Swab  Result Value Ref Range Status   SARS Coronavirus 2 NEGATIVE NEGATIVE Final    Comment: (NOTE) SARS-CoV-2 target nucleic acids are NOT DETECTED.  The SARS-CoV-2 RNA is generally detectable in upper and lower respiratory specimens during the acute phase of infection. The lowest concentration of SARS-CoV-2 viral copies this assay can detect is 250 copies / mL. A negative result does not preclude SARS-CoV-2 infection and should not be used as the sole basis for treatment or other patient management decisions.  A negative result may occur with improper specimen collection / handling, submission of specimen  other than nasopharyngeal swab, presence of viral mutation(s) within the areas targeted by this assay, and inadequate number of viral copies (<250 copies / mL). A negative result must be combined with clinical observations, patient history, and epidemiological information.  Fact Sheet for Patients:   BoilerBrush.com.cy  Fact Sheet for Healthcare Providers: https://pope.com/  This test is not yet approved or  cleared by the [place] FDA and has been authorized for detection and/or diagnosis of SARS-CoV-2 by FDA under an Emergency Use Authorization (EUA).  This EUA will remain in effect (meaning this test can be used) for the duration of the COVID-19 declaration under Section 564(b)(1) of the Act, 21 U.S.C. section 360bbb-3(b)(1), unless the authorization is terminated or revoked sooner.  Performed at [facility], [address] [place], [place] [address]   MRSA PCR Screening     Status: None   Collection Time: 01/01/20  2:30 PM   Specimen: Nasopharyngeal  Result Value Ref Range Status   MRSA by PCR NEGATIVE NEGATIVE Final    Comment:        The GeneXpert MRSA Assay (FDA approved for NASAL specimens only), is one component of a comprehensive MRSA colonization surveillance program. It is not intended to diagnose MRSA infection nor to guide or monitor treatment for MRSA infections. Performed at [facility], [address] [place], [place] [address]          Radiology Studies: CT ANGIO CHEST PE W OR WO CONTRAST  Result Date: 12/31/2019 CLINICAL DATA:  24 year old female with concern for pulmonary embolism. EXAM: CT ANGIOGRAPHY CHEST WITH CONTRAST TECHNIQUE: Multidetector CT imaging of the chest was performed using the standard protocol during bolus administration of intravenous contrast. Multiplanar CT image reconstructions and MIPs were obtained to evaluate the vascular anatomy. CONTRAST:  65mL  OMNIPAQUE IOHEXOL 350 MG/ML SOLN COMPARISON:  None. FINDINGS: Cardiovascular: There is no cardiomegaly or pericardial effusion. The thoracic aorta is unremarkable. The origins of the great vessels of the aortic arch appear patent. Evaluation of the pulmonary arteries is very limited due to suboptimal opacification and timing of the contrast. There is however intraluminal filling defects noted involving the left lower lobe lobar and segmental branches consistent with pulmonary emboli. No CT evidence of right heart straining. Mediastinum/Nodes: No hilar or mediastinal adenopathy. The esophagus and the thyroid gland are grossly unremarkable. No mediastinal fluid collection. Lungs/Pleura: Minimal right lung base atelectasis. No focal consolidation, or pneumothorax. Probable trace right pleural effusion. There  is a 4 mm right upper lobe nodule (36/6), likely a partially calcified granuloma. The central airways are patent. Upper Abdomen: No acute abnormality. Musculoskeletal: No chest wall abnormality. No acute or significant osseous findings. Review of the MIP images confirms the above findings. IMPRESSION: Left lower lobe pulmonary emboli. No CT evidence of right heart straining. These results were called by telephone at the time of interpretation on 12/31/2019 at 8:37 pm to provider Manuela Schwartz, who verbally acknowledged these results. Electronically Signed   By: Elgie Collard M.D.   On: 12/31/2019 20:42   PERIPHERAL VASCULAR CATHETERIZATION  Result Date: 01/01/2020 See op note  US Venous Img Lower Unilateral Left  Result Date: 12/31/2019 CLINICAL DATA:  PE. EXAM: LEFT LOWER EXTREMITY VENOUS DOPPLER ULTRASOUND TECHNIQUE: Gray-scale sonography with compression, as well as color and duplex ultrasound, were performed to evaluate the deep venous system(s) from the level of the common femoral vein through the popliteal and proximal calf veins. COMPARISON:  None. FINDINGS: VENOUS There is extensive occlusive  thrombus throughout the left lower extremity including the left CFV, profundus femoral, superficial femoral vein, popliteal vein, and tibial veins. Limited views of the contralateral common femoral vein are unremarkable. OTHER Lower extremity edema is noted. Limitations: none IMPRESSION: Extensive occlusive thrombus is noted involving essentially all of the deep veins of the left lower extremity. These results were called by telephone at the time of interpretation on 12/31/2019 at 5:15 pm to provider JENISE MENSHEW , who verbally acknowledged these results. Electronically Signed   By: Katherine Mantle M.D.   On: 12/31/2019 17:18        Scheduled Meds:  apixaban  10 mg Oral BID   Followed by   Melene Muller ON 01/09/2020] apixaban  5 mg Oral BID   Chlorhexidine Gluconate Cloth  6 each Topical Daily   Continuous Infusions:  sodium chloride Stopped (01/01/20 1300)     LOS: 1 day    Time spent: 25 minutes    Tresa Moore, MD [facility] Pager [phone]  If 7PM-7AM, please contact night-coverage 01/02/2020, 11:12 AM

## 2020-01-02 NOTE — Progress Notes (Signed)
ANTICOAGULATION CONSULT NOTE  Pharmacy Consult for apixaban Indication: DVT  No Known Allergies  Patient Measurements: Height: 5\' 5"  (165.1 cm) Weight: 114.2 kg (251 lb 12.8 oz) IBW/kg (Calculated) : 57 Heparin Dosing Weight: 63 kg  Vital Signs: Temp: 99.1 F (37.3 C) (08/13 0000) Temp Source: Oral (08/13 0000) BP: 108/62 (08/13 0300) Pulse Rate: 93 (08/13 0300)  Labs: Recent Labs    12/31/19 1602 12/31/19 1602 12/31/19 2141 01/01/20 0146 01/01/20 0146 01/01/20 0758 01/01/20 1127 01/01/20 1127 01/01/20 1523 01/01/20 2055 01/02/20 0704  HGB 9.8*   < >  --  9.1*   < >  --  8.3*   < > 8.7*  --  7.2*  HCT 31.9*   < >  --  29.5*   < >  --  28.6*  --  27.1*  --  24.5*  PLT 120*   < >  --  142*   < >  --  125*  --  72*  --  69*  APTT  --   --  95*  --   --   --   --   --   --   --   --   LABPROT 13.6  --   --   --   --   --   --   --   --   --   --   INR 1.1  --   --   --   --   --   --   --   --   --   --   HEPARINUNFRC  --   --   --  0.36   < > 0.30  --   --   --  0.17* 0.45  CREATININE 0.79  --   --  0.58  --   --   --   --   --   --  0.57   < > = values in this interval not displayed.    Estimated Creatinine Clearance: 135.6 mL/min (by C-G formula based on SCr of 0.57 mg/dL).   Medical History: Past Medical History:  Diagnosis Date  . DVT (deep venous thrombosis) (HCC)   . Pulmonary embolus (HCC) 2020    Assessment: 24 year old female presented with leg pain. Patient is an 24 with a h/o PE and cerebral venous thrombosis, previously on Eliquis but has not taken for approximately 25 year per ED note. EDP She is currently POD # 1 mechanical thrombectomy. H&H, platelets are low but appear to have stabilized.  Heparin Course 8/11 initiation: 4200 unit bolus, then 1000 units/hr 8/12 ~ 0900 - 1500 held for vascular procedure, restarted at 1050 units/hr 8/12  2055 HL 0.17: inc to 1200 units/hr 8/13  0704 HL 0.45: stopped   Goal of Therapy:   Monitor platelets by anticoagulation protocol: Yes   Plan:   Stop heparin  One hour after stopping heparin start apixaban 10 mg twice daily for 7 days followed by 5 mg twice daily  Next CBC in am: continue to follow  9/13, PharmD 01/02/2020,9:12 AM

## 2020-01-02 NOTE — Progress Notes (Signed)
   01/02/20 1630  Clinical Encounter Type  Visited With Patient;Health care provider  Visit Type Follow-up  Referral From Chaplain  Consult/Referral To Chaplain  While coming out of another room, chaplain saw patient being transported to another room and asked what room she was going to? Staff said 212. Chaplain stopped in to see patient, but staff was in with her. Chaplain will follow up later.

## 2020-01-02 NOTE — Progress Notes (Signed)
[facility] Daily Progress Note   Subjective: 01/01/20: 1. US guidance for vascular access to left popliteal vein 2. Catheter placement into left iliac vein from left popliteal approach 3. IVC gram and left lower extremity venogram 4.   Catheter directed thrombolysis with 10 mg of TPA to the left superficial femoral vein, common femoral vein, external and common iliac veins 5. Mechanical thrombectomy to left superficial femoral vein, common femoral vein, external iliac vein, common iliac vein, and inferior vena cava 6. PTA of the left superficial femoral and common femoral vein with 12 mm balloon 7.   PTA of the left external and common iliac veins with 12 mm balloon  Patient without complaint this AM.  Left lower extremity discomfort has improved.  No acute issues overnight.  Objective: Vitals:   01/02/20 0000 01/02/20 0100 01/02/20 0200 01/02/20 0300  BP: (!) 109/57 (!) 117/53 (!) 111/54 108/62  Pulse: 100 (!) 101 99 93  Resp: (!) 23 (!) 21 (!) 21 19  Temp: 99.1 F (37.3 C)     TempSrc: Oral     SpO2: 96% 96% 93% 98%  Weight:      Height:        Intake/Output Summary (Last 24 hours) at 01/02/2020 1013 Last data filed at 01/02/2020 0426 Gross per 24 hour  Intake 470.87 ml  Output 950 ml  Net -479.13 ml   Physical Exam: A&Ox3, NAD CV: RRR Pulmonary: CTA Bilaterally Abdomen: Soft, Nontender, Nondistended Left lower extremity access site: No swelling or drainage noted. Vascular:  Left lower extremity: Thigh soft.  Calf soft.  The extremity is edematous however there is no acute vascular compromise noted at this time.  Extremities warm distally to toes.  Good capillary refill.  Hard to palpate pedal pulses due to edema.  Motor/sensory is intact.   Laboratory: CBC    Component Value Date/Time   WBC 10.0 01/02/2020 0704   HGB 7.2 (L) 01/02/2020 0704   HCT 24.5 (L) 01/02/2020 0704   PLT 69 (L) 01/02/2020 0704   BMET    Component Value  Date/Time   NA 142 01/02/2020 0704   K 4.4 01/02/2020 0704   CL 108 01/02/2020 0704   CO2 25 01/02/2020 0704   GLUCOSE 99 01/02/2020 0704   BUN 11 01/02/2020 0704   CREATININE 0.57 01/02/2020 0704   CALCIUM 8.5 (L) 01/02/2020 0704   GFRNONAA >60 01/02/2020 0704   GFRAA >60 01/02/2020 0704   Assessment/Planning: The patient is a 24 year old female who presented with extensive left lower extremity DVT status post venous thrombectomy and thrombolysis - POD#1  1) discontinue heparin and transition to Eliquis with loading dose.  Platelets were relatively stable this AM.  Continue to monitor.  AM CBC. 2) encouraged ambulation and out of bed to chair today 3) off pressor support currently maintaining stable vital signs  4) okay to transfer out of ICU from our standpoint  Discussed with Dr. Wallis Mart [facility] PA-C 01/02/2020 10:13 AM

## 2020-01-02 NOTE — Progress Notes (Signed)
ANTICOAGULATION CONSULT NOTE  Pharmacy Consult for heparin Indication: DVT  No Known Allergies  Patient Measurements: Height: 5\' 5"  (165.1 cm) Weight: 114.2 kg (251 lb 12.8 oz) IBW/kg (Calculated) : 57 Heparin Dosing Weight: 63 kg  Vital Signs: Temp: 99.1 F (37.3 C) (08/13 0000) Temp Source: Oral (08/13 0000) BP: 108/62 (08/13 0300) Pulse Rate: 93 (08/13 0300)  Labs: Recent Labs    12/31/19 1602 12/31/19 1602 12/31/19 2141 01/01/20 0146 01/01/20 0146 01/01/20 0758 01/01/20 1127 01/01/20 1523 01/01/20 2055  HGB 9.8*   < >  --  9.1*   < >  --  8.3* 8.7*  --   HCT 31.9*   < >  --  29.5*  --   --  28.6* 27.1*  --   PLT 120*   < >  --  142*  --   --  125* 72*  --   APTT  --   --  95*  --   --   --   --   --   --   LABPROT 13.6  --   --   --   --   --   --   --   --   INR 1.1  --   --   --   --   --   --   --   --   HEPARINUNFRC  --   --   --  0.36  --  0.30  --   --  0.17*  CREATININE 0.79  --   --  0.58  --   --   --   --   --    < > = values in this interval not displayed.    Estimated Creatinine Clearance: 135.6 mL/min (by C-G formula based on SCr of 0.58 mg/dL).   Medical History: Past Medical History:  Diagnosis Date  . DVT (deep venous thrombosis) (HCC)   . Pulmonary embolus (HCC) 2020    Assessment: 24 year old female presented with leg pain. Patient is an 21 with a h/o PE and cerebral venous thrombosis, previously on Eliquis but has not taken for approximately one year per ED note. EDP She is currently POD # 1 thrombectomy. H&H, platelets are low and trending lower.  Heparin Course 8/11 initiation: 4200 unit bolus, then 1000 units/hr 8/12 ~ 0900 - 1500 held for vascular procedure, restarted at 1050 units/hr 8/12  2055 HL 0.17: inc to 1200 units/hr 8/13  0704 HL 0.45: no change  Goal of Therapy:  Heparin level 0.3-0.7 units/ml Monitor platelets by anticoagulation protocol: Yes   Plan:   Anti-Xa level is therapeutic: continue  heparin infusion at 1200 units/hr  recheck anti-Xa level at 1400  Next CBC in am: continue to follow  9/13, PharmD 01/02/2020,7:21 AM

## 2020-01-02 NOTE — Discharge Instructions (Signed)
Vascular Surgery Discharge Instructions  1) You may shower 2) When you are not active, please elevate the left lower extremity at heart level or higher

## 2020-01-03 LAB — BASIC METABOLIC PANEL
Anion gap: 9 (ref 5–15)
BUN: 10 mg/dL (ref 6–20)
CO2: 26 mmol/L (ref 22–32)
Calcium: 9 mg/dL (ref 8.9–10.3)
Chloride: 103 mmol/L (ref 98–111)
Creatinine, Ser: 0.67 mg/dL (ref 0.44–1.00)
GFR calc Af Amer: >60 mL/min (ref >60)
GFR calc non Af Amer: >60 mL/min (ref >60)
Glucose, Bld: 97 mg/dL (ref 70–99)
Potassium: 4.3 mmol/L (ref 3.5–5.1)
Sodium: 138 mmol/L (ref 135–145)

## 2020-01-03 LAB — CBC WITH DIFFERENTIAL/PLATELET
Abs Immature Granulocytes: 0.03 10*3/uL (ref 0.00–0.07)
Basophils Absolute: 0 10*3/uL (ref 0.0–0.1)
Basophils Relative: 1 %
Eosinophils Absolute: 0.2 10*3/uL (ref 0.0–0.5)
Eosinophils Relative: 3 %
HCT: 23.8 % — ABNORMAL LOW (ref 36.0–46.0)
Hemoglobin: 7.4 g/dL — ABNORMAL LOW (ref 12.0–15.0)
Immature Granulocytes: 0 %
Lymphocytes Relative: 24 %
Lymphs Abs: 2 10*3/uL (ref 0.7–4.0)
MCH: 24.1 pg — ABNORMAL LOW (ref 26.0–34.0)
MCHC: 31.1 g/dL (ref 30.0–36.0)
MCV: 77.5 fL — ABNORMAL LOW (ref 80.0–100.0)
Monocytes Absolute: 0.5 10*3/uL (ref 0.1–1.0)
Monocytes Relative: 6 %
Neutro Abs: 5.8 10*3/uL (ref 1.7–7.7)
Neutrophils Relative %: 66 %
Platelets: 72 10*3/uL — ABNORMAL LOW (ref 150–400)
RBC: 3.07 MIL/uL — ABNORMAL LOW (ref 3.87–5.11)
RDW: 18.3 % — ABNORMAL HIGH (ref 11.5–15.5)
WBC: 8.7 10*3/uL (ref 4.0–10.5)
nRBC: 0 % (ref 0.0–0.2)

## 2020-01-03 LAB — TYPE AND SCREEN
ABO/RH(D): A POS
Antibody Screen: NEGATIVE
Unit division: 0
Unit division: 0

## 2020-01-03 LAB — BPAM RBC
Blood Product Expiration Date: 202109022359
Blood Product Expiration Date: 202109092359
Unit Type and Rh: 6200
Unit Type and Rh: 6200

## 2020-01-03 LAB — PREPARE RBC (CROSSMATCH)

## 2020-01-03 MED ORDER — GABAPENTIN 300 MG PO CAPS
300.0000 mg | ORAL_CAPSULE | Freq: Three times a day (TID) | ORAL | Status: DC
  Administered 2020-01-03 – 2020-01-06 (×10): 300 mg via ORAL
  Filled 2020-01-03 (×10): qty 1

## 2020-01-03 MED ORDER — KETOROLAC TROMETHAMINE 15 MG/ML IJ SOLN
15.0000 mg | Freq: Four times a day (QID) | INTRAMUSCULAR | Status: DC
  Administered 2020-01-03 – 2020-01-06 (×13): 15 mg via INTRAVENOUS
  Filled 2020-01-03 (×13): qty 1

## 2020-01-03 MED ORDER — ACETAMINOPHEN 325 MG PO TABS
650.0000 mg | ORAL_TABLET | Freq: Four times a day (QID) | ORAL | Status: DC | PRN
  Administered 2020-01-03: 650 mg via ORAL
  Filled 2020-01-03: qty 2

## 2020-01-03 NOTE — Evaluation (Signed)
Physical Therapy Evaluation Patient Details Name: Caroline Barry MRN: [id number] DOB: 26-Oct-1995 Today's Date: 01/03/2020   History of Present Illness    Caroline Barry is a 24 y.o. female with medical history significant for PE (last yr), central venous thrombosis, migraine, tobacco use who presents with left leg edema and increased pain from hip to knees (LLE), difficulty walking d/t knee pain (posteriorly). Was started on Eliquis but DCed due to financial constraints. Imaging revealed extensive thrombus of all deep veins of LLE, L lower lobe PE. Pt s/p thrombolysis and mechanical thrombectomy (8/12) involving L superficial femoral, common femoral, external and common iliac veins, and IVC; as well as PTA of L superficial femoral, common femoral, L external and common iliac veins.  No pulmonary intervention recommended at this time per vascular consult with exception of Eliquis ~1 year.  Clinical Impression  Pt is a pleasant 24 year old F who was admitted for DVT with PMH and health history as described above. Pt performs bed mobility with mod I requiring extra time and heavy reliance on UEs, transfers with CGA (limited primarily by painful weight bearing through LLE), and ambulation with CGA with RW (~13ft). Pt able to demo safe STS transfer x2 from bedside (trialing without and with RW) with cuing for foot placment/more equal weight-bearing and hand placement to avoid pulling up with RW.  Pt limited in gait distance this session secondary to reports of lightheadedness, general tiredness and increasing HR (within context of PE); pt's HR increasing to 125 following STS and to 142 after taking a couple of steps; decreases to 114 bpm with brief seated rest break. Pt demonstrates deficits with pain, range of motion and strength limiting ability to perform functional mobility and requiring continued skilled PT intervention. 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 Recommend DC with home health.      Follow Up Recommendations Home health PT    Equipment Recommendations  Other (comment);Rolling walker with 5" wheels;3in1 (PT) (bariatric RW)    Recommendations for Other Services       Precautions / Restrictions Precautions Precautions: Fall Restrictions Weight Bearing Restrictions: No      Mobility  Bed Mobility Overal bed mobility: Modified Independent             General bed mobility comments: Slow to perform supine>sit secondary to pain, though not requiring any physical assistance; heavy reliance on UEs to pivot to bedside once in long sitting.  Transfers Overall transfer level: Needs assistance Equipment used: Rolling walker (2 wheeled) Transfers: Sit to/from Stand Sit to Stand: Min guard         General transfer comment: performing with and without RW; improved comfort and steadiness with RW. Requiring CGA for safety due to painful WBing through LLE. Able to demo safe hand placement.  Ambulation/Gait Ambulation/Gait assistance: Min guard Gait Distance (Feet): 5 Feet Assistive device: Rolling walker (2 wheeled) Gait Pattern/deviations: Decreased stance time - left;Step-to pattern;Antalgic     General Gait Details: Limiting gait distance to ~5' for safety due to pt reporting mild lightheadedness and increased HR (within context of recent DVT/PE)  Stairs            Wheelchair Mobility    Modified Rankin (Stroke Patients Only)       Balance Overall balance assessment: Needs assistance Sitting-balance support: No upper extremity  supported (1 foot supporting primarily due to painful WBing LLE) Sitting balance-Leahy Scale: Good Sitting balance - Comments: Able to sit steady bedside and weight shift   Standing balance support: No upper extremity supported;During  functional activity Standing balance-Leahy Scale: Fair Standing balance comment: without RW, unsteadiness due to painful LLE weight bearing; much improved with RW for support                             Pertinent Vitals/Pain Pain Assessment: 0-10 Pain Score: 2  Pain Location: groin, anteromedial L knee; aching in calf Pain Descriptors / Indicators: Burning;Aching Pain Intervention(s): Limited activity within patient's tolerance    Home Living Family/patient expects to be discharged to:: Private residence Living Arrangements: Alone Available Help at Discharge: Family (possibly mother or sister (mom lives in [place]; pt and sister live in [place])) Type of Home: House Home Access: Level entry     Home Layout: One level Home Equipment: None Additional Comments: 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 Mom lives in [place] and will likely be coming here to give pt a ride back home to [place]. She may be able to stay with sister temporarily in [place], or maybe able to stay with mom in [place].    Prior Function Level of Independence: Independent         Comments: Works (Naval architect), independent all ADLs     Hand Dominance        Extremity/Trunk Assessment   Upper Extremity Assessment Upper Extremity Assessment: Overall WFL for tasks assessed    Lower Extremity Assessment Lower Extremity Assessment: Overall WFL for tasks assessed;LLE deficits/detail LLE Deficits / Details: L knee range of motion limited by pain to approx 30 deg in supine.       Communication   Communication: No difficulties  Cognition Arousal/Alertness: Awake/alert Behavior During Therapy: WFL for tasks assessed/performed Overall Cognitive Status: Within Functional Limits for tasks assessed                                 General Comments: A&Ox4      General Comments      Exercises Total Joint Exercises Ankle Circles/Pumps: AROM;20 reps;Supine;Both Quad Sets: AROM;10  reps;Left Heel Slides: AAROM;Left;10 reps (small range w/in pts tolerance) Knee Flexion: Seated;AROM;Left;5 reps   Assessment/Plan    PT Assessment Patient needs continued PT services  PT Problem List Decreased strength;Decreased mobility;Decreased range of motion;Decreased activity tolerance;Decreased balance;Pain       PT Treatment Interventions DME instruction;Therapeutic exercise;Gait training;Balance training;Neuromuscular re-education;Functional mobility training;Therapeutic activities;Patient/family education    PT Goals (Current goals can be found in the Care Plan section)  Acute Rehab PT Goals Patient Stated Goal: to leave hospital, get better and go back to work PT Goal Formulation: With patient Time For Goal Achievement: 01/17/20 Potential to Achieve Goals: Good    Frequency Min 2X/week   Barriers to discharge        Co-evaluation               AM-PAC PT "6 Clicks" Mobility  Outcome Measure Help needed turning from your back to your side while in a flat bed without using bedrails?: None Help needed moving from lying on your back to sitting on the side of a flat bed without using bedrails?: None Help needed moving to and from a bed to a chair (including  a wheelchair)?: A Little Help needed standing up from a chair using your arms (e.g., wheelchair or bedside chair)?: A Little Help needed to walk in hospital room?: A Little Help needed climbing 3-5 steps with a railing? : A Lot 6 Click Score: 19    End of Session Equipment Utilized During Treatment: Gait belt Activity Tolerance: Patient tolerated treatment well Patient left: in bed (sitting edge of bed, with handoff to OT) Nurse Communication: Mobility status PT Visit Diagnosis: Unsteadiness on feet (R26.81);Pain;Muscle weakness (generalized) (M62.81) Pain - Right/Left: Left Pain - part of body: Knee;Leg    Time: 4585-9292 PT Time Calculation (min) (ACUTE ONLY): 53 min   Charges:   PT Evaluation $PT  Eval Low Complexity: 1 Low PT Treatments $Therapeutic Exercise: 8-22 mins $Therapeutic Activity: 8-22 mins        Kendal Hymen, PT, DPT 01/03/20, 3:06 PM

## 2020-01-03 NOTE — Progress Notes (Signed)
PROGRESS NOTE    Caroline Barry  HKV:[id number] DOB: 1995/09/09 DOA: 12/31/2019 PCP: Patient, No Pcp Per  Brief Narrative:  HPI: Caroline Barry is a 24 y.o. female with medical history significant for PE, central venous thrombosis, migraine, tobacco use who presents with concern of left leg edema and increased pain.  Patient says that for the past 2 days she has noticed increased asymmetric swelling of her left lower leg.  Also began to notice painful cramps from her hip down to her knees.  Also from knee down to her feet she feels numbness and tingling.  She has a hard time walking due to the pain behind her knees.  Patient uses tobacco and smokes cigars.  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  She had history of PE last year and a central venous thrombus about 2 years ago.  She was never told the reasoning for this.  She was started on Eliquis but stopped due to financial constraints.  Now she only takes aspirin occasionally.  She denies any chest pain or shortness of breath which she has felt before during her prior PE.  She denies any use of hormonal birth control.  Denies any family history of clotting disorders.  8/12: Patient seen and examined.  Hemodynamically stable.  Complains of pain in left leg.  Communicated with Dr. Wyn Quaker.  Plan for mechanical thrombectomy today.  8/13: Patient seen and examined.  Status post mechanical thrombectomy with vascular surgery yesterday.  Also underwent catheter directed thrombolysis given extensive clot burden of left lower extremity.  This morning left leg discomfort improved.  Postoperatively required admission to the intensive care unit and short period of time on vasopressor support for hypotension.  Now recovered and off all pressors.  Stable for discharge out of ICU.  8/14: Patient seen and examined.  Still with some pain in the left leg.  Left leg still swollen and tender to touch.  Remains  on Eliquis for anticoagulation.  hemoglobin to 7.4 noted this morning.  Stable over interval.  Platelet count actually improving.  Metabolic profile within normal limits.  Vital signs stable.  No decompensating events overnight.   Assessment & Plan:   Principal Problem:   DVT (deep venous thrombosis) (HCC) Active Problems:   Pulmonary embolism (HCC)   Microcytic anemia   Thrombocytopenia (HCC)  DVT of the left lower extremity Ultrasound shows DVT of all deep veins in the left lower extremity Dr. Wyn Quaker, vascular surgery on consult Mechanical thrombectomy 8/12 Required short ICU stay for vasopressor support postoperatively Transferred back to MedSurg 01/02/2020 IV heparin stopped, Eliquis started Plan: Continue Eliquis 10 mg twice daily x7 days, followed by 5 mg twice daily indefinitely As needed pain control Case management aware.  Patient was previously on Eliquis but stopped due to financial constraints Patient still with substantial swelling in the area.  Will encourage to mobilize.  Up in chair as tolerated.  Physical therapy consulted.  Left lower lobe pulmonary embolism No evidence of right heart strain P.o. Eliquis initiated as above  Defer echocardiogram for now  Microcytic anemia Stable and last hemoglobin a year ago was about 9 Will check iron panel Suspect chronic iron deficiency anemia No indication for transfusion currently Transfuse as needed hemoglobin less than 7 or higher threshold if actively bleeding  Thrombocytopenia Likely secondary to significant DVT and PE.   Relatively stable No bleeding noted Transitioned to Eliquis Check daily CBC  Tobacco use Discussed cessation  especially with her significant history of DVTs and PE  Morbid obesity BMI 41.9 Counseled patient    DVT prophylaxis: Eliquis Code Status: Full Family Communication: None today Disposition Plan: Status is: Inpatient  Remains inpatient appropriate because:Inpatient level of  care appropriate due to severity of illness   Dispo: The patient is from: Home              Anticipated d/c is to: Home              Anticipated d/c date is: 2 days              Patient currently is not medically stable to d/c.   Currently postoperative day 2.  On p.o. Eliquis.  Seems to be tolerating well.  Platelet count slowly improving.  Anticipate we will need TOC involvement because patient was previously on Eliquis but had stopped due to financial constraints.  Will ambulate cautiously today.  Up in chair.  PT consulted.  Disposition plan pending, anticipate with the next 48 hours.  Consultants:   Vascular surgery  Procedures:   Mechanical thrombectomy, 01/01/20  Antimicrobials:   None   Subjective: Seen and examined.  Status post vascular intervention.  Continues to endorse pain in left leg although improving over interval Objective: Vitals:   01/02/20 1934 01/03/20 0112 01/03/20 0440 01/03/20 1221  BP: 117/62 120/67 107/66 140/84  Pulse: 100 98 97 98  Resp: 18 20 20 18   Temp: 98.1 F (36.7 C) 97.9 F (36.6 C) (!) 97.5 F (36.4 C) 98.2 F (36.8 C)  TempSrc:    Oral  SpO2: 100% 98% 98% 100%  Weight:      Height:        Intake/Output Summary (Last 24 hours) at 01/03/2020 1351 Last data filed at 01/03/2020 1300 Gross per 24 hour  Intake 840 ml  Output --  Net 840 ml   Filed Weights   12/31/19 1425 12/31/19 2124  Weight: 63 kg 114.2 kg    Examination:  General: No apparent distress, patient appears well HEENT: Normocephalic, atraumatic Neck, supple, trachea midline, no tenderness Heart: Regular rate and rhythm, S1/S2 normal, no murmurs Lungs: Clear to auscultation bilaterally, no adventitious sounds, normal work of breathing Abdomen: Obese, soft, nontender, nondistended, positive bowel sounds Extremities: Left lower extremity swollen, tender to touch, decreased range of motion, improving over interval Skin: No rashes or lesions, normal  color Neurologic: Cranial nerves grossly intact, sensation intact, alert and oriented x3 Psychiatric: Normal affect       Data Reviewed: I have personally reviewed following labs and imaging studies  CBC: Recent Labs  Lab 12/31/19 1602 12/31/19 1602 01/01/20 0146 01/01/20 1127 01/01/20 1523 01/02/20 0704 01/03/20 0735  WBC 14.2*   < > 11.5* 7.0 17.9* 10.0 8.7  NEUTROABS 10.3*  --   --   --   --  6.7 5.8  HGB 9.8*   < > 9.1* 8.3* 8.7* 7.2* 7.4*  HCT 31.9*   < > 29.5* 28.6* 27.1* 24.5* 23.8*  MCV 77.6*   < > 76.4* 80.6 75.7* 80.6 77.5*  PLT 120*   < > 142* 125* 72* 69* 72*   < > = values in this interval not displayed.   Basic Metabolic Panel: Recent Labs  Lab 12/31/19 1602 01/01/20 0146 01/02/20 0704 01/03/20 0735  NA 139 138 142 138  K 3.7 3.9 4.4 4.3  CL 102 106 108 103  CO2 25 23 25 26   GLUCOSE 110* 107* 99 97  BUN 12 12 11 10   CREATININE 0.79 0.58 0.57 0.67  CALCIUM 9.0 8.5* 8.5* 9.0   GFR: Estimated Creatinine Clearance: 135.6 mL/min (by C-G formula based on SCr of 0.67 mg/dL). Liver Function Tests: Recent Labs  Lab 12/31/19 1602  AST 17  ALT 14  ALKPHOS 63  BILITOT 0.3  PROT 8.1  ALBUMIN 4.2   No results for input(s): LIPASE, AMYLASE in the last 168 hours. No results for input(s): AMMONIA in the last 168 hours. Coagulation Profile: Recent Labs  Lab 12/31/19 1602  INR 1.1   Cardiac Enzymes: No results for input(s): CKTOTAL, CKMB, CKMBINDEX, TROPONINI in the last 168 hours. BNP (last 3 results) No results for input(s): PROBNP in the last 8760 hours. HbA1C: No results for input(s): HGBA1C in the last 72 hours. CBG: No results for input(s): GLUCAP in the last 168 hours. Lipid Profile: No results for input(s): CHOL, HDL, LDLCALC, TRIG, CHOLHDL, LDLDIRECT in the last 72 hours. Thyroid Function Tests: No results for input(s): TSH, T4TOTAL, FREET4, T3FREE, THYROIDAB in the last 72 hours. Anemia Panel: Recent Labs    01/01/20 0146  TIBC 386   IRON 10*   Sepsis Labs: Recent Labs  Lab 12/31/19 1602 12/31/19 2141  LATICACIDVEN 1.3 2.0*    Recent Results (from the past 240 hour(s))  SARS Coronavirus 2 by RT PCR (hospital order, performed in [facility] hospital lab) Nasopharyngeal Nasopharyngeal Swab     Status: None   Collection Time: 12/31/19  5:57 PM   Specimen: Nasopharyngeal Swab  Result Value Ref Range Status   SARS Coronavirus 2 NEGATIVE NEGATIVE Final    Comment: (NOTE) SARS-CoV-2 target nucleic acids are NOT DETECTED.  The SARS-CoV-2 RNA is generally detectable in upper and lower respiratory specimens during the acute phase of infection. The lowest concentration of SARS-CoV-2 viral copies this assay can detect is 250 copies / mL. A negative result does not preclude SARS-CoV-2 infection and should not be used as the sole basis for treatment or other patient management decisions.  A negative result may occur with improper specimen collection / handling, submission of specimen other than nasopharyngeal swab, presence of viral mutation(s) within the areas targeted by this assay, and inadequate number of viral copies (<250 copies / mL). A negative result must be combined with clinical observations, patient history, and epidemiological information.  Fact Sheet for Patients:   03/01/20  Fact Sheet for Healthcare Providers: BoilerBrush.com.cy  This test is not yet approved or  cleared by the https://pope.com/ FDA and has been authorized for detection and/or diagnosis of SARS-CoV-2 by FDA under an Emergency Use Authorization (EUA).  This EUA will remain in effect (meaning this test can be used) for the duration of the COVID-19 declaration under Section 564(b)(1) of the Act, 21 U.S.C. section 360bbb-3(b)(1), unless the authorization is terminated or revoked sooner.  Performed at [facility], [address] [place], [place]   MRSA PCR  Screening     Status: None   Collection Time: 01/01/20  2:30 PM   Specimen: Nasopharyngeal  Result Value Ref Range Status   MRSA by PCR NEGATIVE NEGATIVE Final    Comment:        The GeneXpert MRSA Assay (FDA approved for NASAL specimens only), is one component of a comprehensive MRSA colonization surveillance program. It is not intended to diagnose MRSA infection nor to guide or monitor treatment for MRSA infections. Performed at [facility], [address], [place], [place]  Radiology Studies: No results found.      Scheduled Meds: . apixaban  10 mg Oral BID   Followed by  . [START ON 01/09/2020] apixaban  5 mg Oral BID  . gabapentin  300 mg Oral TID  . ketorolac  15 mg Intravenous Q6H   Continuous Infusions: . sodium chloride Stopped (01/01/20 1300)     LOS: 2 days    Time spent: 25 minutes    Tresa MooreSudheer B Terressa Evola, MD [facility] Pager [phone]  If 7PM-7AM, please contact night-coverage 01/03/2020, 1:51 PM

## 2020-01-03 NOTE — Evaluation (Signed)
Occupational Therapy Evaluation Patient Details Name: Caroline Barry MRN: [id number] DOB: 1995/11/15 Today's Date: 01/03/2020    History of Present Illness Caroline Barry is a 24 y.o. female with medical history significant for PE (last yr), central venous thrombosis, migraine, tobacco use who presents with left leg edema and increased pain from hip to knees (LLE), difficulty walking d/t knee pain (posteriorly). Was started on Eliquis but DCed due to financial constraints. Imaging revealed extensive thrombus of all deep veins of LLE, L lower lobe PE. Pt s/p thrombolysis and mechanical thrombectomy (8/12) involving L superficial femoral, common femoral, external and common iliac veins, and IVC; as well as PTA of L superficial femoral, common femoral, L external and common iliac veins.  No pulmonary intervention recommended at this time per vascular consult with exception of Eliquis ~1 year.   Clinical Impression   Caroline Barry was seen for OT evaluation this date. 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 Pt presents to acute OT demonstrating impaired ADL performance and functional mobility 2/2 functional strength/ROM deficits, decreased activity tolerance, pain, and decreased LB access. Pt currently requires SBA + RW tooth brushing standing sink side. SBA don/doff R sock, MOD A don/doff L sock - assist to thread over toes/heel. SBA + RW for ADL t/f. Pt would benefit from skilled OT to address noted impairments and functional limitations (see below for any additional details) in order to maximize safety and independence while minimizing falls risk and caregiver burden. Upon hospital discharge, recommend HHOT to maximize pt safety and return to functional independence during meaningful occupations of daily life.     Follow Up Recommendations  Home  health OT    Equipment Recommendations  None recommended by OT    Recommendations for Other Services       Precautions / Restrictions Precautions Precautions: Fall Restrictions Weight Bearing Restrictions: No      Mobility Bed Mobility Overal bed mobility: Modified Independent     General bed mobility comments: MOD I sit>sup requiring RUE to hook LUE for return to bed. Heavy use of BUE for bed mobility   Transfers Overall transfer level: Needs assistance Equipment used: Rolling walker (2 wheeled) Transfers: Sit to/from Stand Sit to Stand: Min guard     General transfer comment: SBA + RW sit<>stand     Balance Overall balance assessment: Needs assistance Sitting-balance support: No upper extremity supported;Feet supported Sitting balance-Leahy Scale: Normal Sitting balance - Comments: Able to sit steady bedside and weight shift   Standing balance support: No upper extremity supported;During functional activity Standing balance-Leahy Scale: Fair Standing balance comment: Intermittent single UE support during standing grooming        ADL either performed or assessed with clinical judgement   ADL Overall ADL's : Needs assistance/impaired    General ADL Comments: SBA + RW tooth brushing standing sink side. SBA don/doff R sock, MOD A don/doff L sock - assist to thread over toes/heel. SBA + RW for ADL t/f       Pertinent Vitals/Pain Pain Assessment: Faces Pain Score: 2  Faces Pain Scale: Hurts a little bit Pain Location: groin, anteromedial L knee; aching in calf Pain Descriptors / Indicators: Burning;Aching Pain Intervention(s): Limited activity within patient's tolerance;Monitored during session;Repositioned     Hand Dominance Right   Extremity/Trunk Assessment Upper Extremity Assessment Upper Extremity Assessment: Overall WFL for tasks assessed  Lower Extremity Assessment Lower Extremity Assessment: LLE deficits/detail LLE Deficits / Details: L knee  AROM limited by pain  LLE: Unable to fully assess due to pain       Communication Communication Communication: No difficulties   Cognition Arousal/Alertness: Awake/alert Behavior During Therapy: WFL for tasks assessed/performed Overall Cognitive Status: Within Functional Limits for tasks assessed       General Comments: A&Ox4   General Comments       Exercises Exercises: Other exercises Other Exercises Other Exercises: Pt educated re: OT role, DME recs, d/c recs, falls prevention, ECS, importance of supervision for safe mobility, pain mgmt, home/routines modifications Other Exercises: LBD, tooth brushing, sitting/standing balance/tolerance, sit>sup, sit<>stand   Shoulder Instructions      Home Living Family/patient expects to be discharged to:: Private residence Living Arrangements: Alone Available Help at Discharge: Family;Available PRN/intermittently (unsure if d/c to mother or sisters) Type of Home: House Home Access: Level entry     Home Layout: One level     Bathroom Shower/Tub: Tub/shower unit         Home Equipment: None   Additional Comments: Pt is a Naval architect and primary residence is in [place], [place]. Mom lives in [place] and will likely be coming here to give pt a ride back home to [place]. She may be able to stay with sister temporarily in [place], or maybe able to stay with mom in [place].      Prior Functioning/Environment Level of Independence: Independent        Comments: Works (Naval architect), independent all ADLs        OT Problem List: Decreased strength;Decreased range of motion;Decreased activity tolerance;Decreased knowledge of use of DME or AE;Pain      OT Treatment/Interventions: Self-care/ADL training;Therapeutic exercise;Energy conservation;DME and/or AE instruction;Therapeutic activities;Patient/family education    OT Goals(Current goals can be found in the care plan section) Acute Rehab OT Goals Patient Stated Goal: to leave hospital, get  better and go back to work OT Goal Formulation: With patient Time For Goal Achievement: 01/17/20 Potential to Achieve Goals: Good ADL Goals Pt Will Perform Grooming: with modified independence;standing (c LRAD PRN for >8 mins) Pt Will Perform Lower Body Dressing: with modified independence;sit to/from stand (c LRAD PRN) Pt Will Transfer to Toilet: with modified independence;stand pivot transfer;bedside commode (c LRAD PRN)  OT Frequency: Min 1X/week   Barriers to D/C: Inaccessible home environment;Decreased caregiver support          Co-evaluation              AM-PAC OT "6 Clicks" Daily Activity     Outcome Measure Help from another person eating meals?: None Help from another person taking care of personal grooming?: A Little Help from another person toileting, which includes using toliet, bedpan, or urinal?: A Little Help from another person bathing (including washing, rinsing, drying)?: A Little Help from another person to put on and taking off regular upper body clothing?: None Help from another person to put on and taking off regular lower body clothing?: A Little 6 Click Score: 20   End of Session Equipment Utilized During Treatment: Rolling walker  Activity Tolerance: Patient tolerated treatment well Patient left: in bed;with call bell/phone within reach  OT Visit Diagnosis: Other abnormalities of gait and mobility (R26.89)                Time: 9735-3299 OT Time Calculation (min): 21 min Charges:  OT General Charges $OT Visit: 1 Visit OT Evaluation $OT Eval Low  Complexity: 1 Low OT Treatments $Self Care/Home Management : 8-22 mins  Kathie Dike, M.S. OTR/L  01/03/20, 3:55 PM  ascom [phone]

## 2020-01-04 LAB — CBC WITH DIFFERENTIAL/PLATELET
Abs Immature Granulocytes: 0.02 10*3/uL (ref 0.00–0.07)
Basophils Absolute: 0.1 10*3/uL (ref 0.0–0.1)
Basophils Relative: 1 %
Eosinophils Absolute: 0.2 10*3/uL (ref 0.0–0.5)
Eosinophils Relative: 3 %
HCT: 23.5 % — ABNORMAL LOW (ref 36.0–46.0)
Hemoglobin: 7 g/dL — ABNORMAL LOW (ref 12.0–15.0)
Immature Granulocytes: 0 %
Lymphocytes Relative: 20 %
Lymphs Abs: 1.6 10*3/uL (ref 0.7–4.0)
MCH: 23.6 pg — ABNORMAL LOW (ref 26.0–34.0)
MCHC: 29.8 g/dL — ABNORMAL LOW (ref 30.0–36.0)
MCV: 79.4 fL — ABNORMAL LOW (ref 80.0–100.0)
Monocytes Absolute: 0.4 10*3/uL (ref 0.1–1.0)
Monocytes Relative: 5 %
Neutro Abs: 5.9 10*3/uL (ref 1.7–7.7)
Neutrophils Relative %: 71 %
Platelets: 110 10*3/uL — ABNORMAL LOW (ref 150–400)
RBC: 2.96 MIL/uL — ABNORMAL LOW (ref 3.87–5.11)
RDW: 17.9 % — ABNORMAL HIGH (ref 11.5–15.5)
WBC: 8.2 10*3/uL (ref 4.0–10.5)
nRBC: 0 % (ref 0.0–0.2)

## 2020-01-04 LAB — BASIC METABOLIC PANEL
Anion gap: 10 (ref 5–15)
BUN: 14 mg/dL (ref 6–20)
CO2: 25 mmol/L (ref 22–32)
Calcium: 9.3 mg/dL (ref 8.9–10.3)
Chloride: 104 mmol/L (ref 98–111)
Creatinine, Ser: 0.73 mg/dL (ref 0.44–1.00)
GFR calc Af Amer: >60 mL/min (ref >60)
GFR calc non Af Amer: >60 mL/min (ref >60)
Glucose, Bld: 89 mg/dL (ref 70–99)
Potassium: 3.9 mmol/L (ref 3.5–5.1)
Sodium: 139 mmol/L (ref 135–145)

## 2020-01-04 NOTE — Progress Notes (Signed)
PROGRESS NOTE    Caroline Barry  UJW:[id number] DOB: 05-Jan-1996 DOA: 12/31/2019 PCP: Patient, No Pcp Per  Brief Narrative:  HPI: Caroline Barry is a 24 y.o. female with medical history significant for PE, central venous thrombosis, migraine, tobacco use who presents with concern of left leg edema and increased pain.  Patient says that for the past 2 days she has noticed increased asymmetric swelling of her left lower leg.  Also began to notice painful cramps from her hip down to her knees.  Also from knee down to her feet she feels numbness and tingling.  She has a hard time walking due to the pain behind her knees.  Patient uses tobacco and smokes cigars.  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  She had history of PE last year and a central venous thrombus about 2 years ago.  She was never told the reasoning for this.  She was started on Eliquis but stopped due to financial constraints.  Now she only takes aspirin occasionally.  She denies any chest pain or shortness of breath which she has felt before during her prior PE.  She denies any use of hormonal birth control.  Denies any family history of clotting disorders.  8/12: Patient seen and examined.  Hemodynamically stable.  Complains of pain in left leg.  Communicated with Dr. Wyn Quakerew.  Plan for mechanical thrombectomy today.  8/13: Patient seen and examined.  Status post mechanical thrombectomy with vascular surgery yesterday.  Also underwent catheter directed thrombolysis given extensive clot burden of left lower extremity.  This morning left leg discomfort improved.  Postoperatively required admission to the intensive care unit and short period of time on vasopressor support for hypotension.  Now recovered and off all pressors.  Stable for discharge out of ICU.  8/14: Patient seen and examined.  Still with some pain in the left leg.  Left leg still swollen and tender to touch.  Remains  on Eliquis for anticoagulation.  hemoglobin to 7.4 noted this morning.  Stable over interval.  Platelet count actually improving.  Metabolic profile within normal limits.  Vital signs stable.  No decompensating events overnight.  8/15: Patient seen and examined.  Still with pain in left leg.  Left leg remains swollen and tense.  Working physical therapy.  Progressing slowly.  Hemoglobin 7.0 this morning.  Platelet count improving to 110.  Metabolic profile largely within normal limits.  No decompensating events overnight.   Assessment & Plan:   Principal Problem:   DVT (deep venous thrombosis) (HCC) Active Problems:   Pulmonary embolism (HCC)   Microcytic anemia   Thrombocytopenia (HCC)  DVT of the left lower extremity Ultrasound shows DVT of all deep veins in the left lower extremity Dr. Wyn Quakerew, vascular surgery on consult Mechanical thrombectomy 8/12 Required short ICU stay for vasopressor support postoperatively Transferred back to [facility] 01/02/2020 IV heparin stopped, Eliquis started Plan: Continue Eliquis 10 mg twice daily x7 days, followed by 5 mg twice daily indefinitely As needed pain control Case management aware.  Patient was previously on Eliquis but stopped due to financial constraints Patient still with substantial swelling in the area.  Will encourage to mobilize.  Up in chair as tolerated.  Physical therapy consulted.  Left lower lobe pulmonary embolism No evidence of right heart strain P.o. Eliquis initiated as above  Defer echocardiogram for now  Microcytic anemia, worsening Stable and last hemoglobin a year ago was about 9 Will check iron  panel Suspect chronic iron deficiency anemia Hemoglobin 7 this morning.  Given the stability on room air we will not transfuse at this time.  Repeat hemoglobin in a.m.  If less than 7 transfuse 1 unit PRBC.  Thrombocytopenia Likely secondary to significant DVT and PE.   Relatively stable No bleeding noted Transitioned to  Eliquis Check daily CBC  Tobacco use Discussed cessation especially with her significant history of DVTs and PE  Morbid obesity BMI 41.9 Counseled patient    DVT prophylaxis: Eliquis Code Status: Full Family Communication: None today Disposition Plan: Status is: Inpatient  Remains inpatient appropriate because:Inpatient level of care appropriate due to severity of illness   Dispo: The patient is from: Home              Anticipated d/c is to: Home              Anticipated d/c date is: 2 days              Patient currently is not medically stable to d/c.  Currently POD #3.  Remains on Eliquis.  Anticipate return to previous home environment.  Will need financial assistance for Eliquis.  Will need case management involvement.  Consultants:   Vascular surgery  Procedures:   Mechanical thrombectomy, 01/01/20  Antimicrobials:   None   Subjective: Seen and examined.  Status post vascular intervention.  Improving pain in left leg.  Improving mobility.  Objective: Vitals:   01/03/20 1441 01/03/20 1932 01/04/20 0440 01/04/20 1143  BP: 124/78 109/63 112/62 124/78  Pulse: 94 93 88 89  Resp:  20 20 18   Temp:  (!) 97.5 F (36.4 C) 98.7 F (37.1 C) 99 F (37.2 C)  TempSrc:  Oral Oral Oral  SpO2: 97% 100% 100% 100%  Weight:      Height:        Intake/Output Summary (Last 24 hours) at 01/04/2020 1340 Last data filed at 01/04/2020 0900 Gross per 24 hour  Intake 240 ml  Output 0 ml  Net 240 ml   Filed Weights   12/31/19 1425 12/31/19 2124  Weight: 63 kg 114.2 kg    Examination:  General: No apparent distress, patient appears well HEENT: Normocephalic, atraumatic Neck, supple, trachea midline, no tenderness Heart: Regular rate and rhythm, S1/S2 normal, no murmurs Lungs: Clear to auscultation bilaterally, no adventitious sounds, normal work of breathing Abdomen: Soft, nontender, nondistended, positive bowel sounds Extremities: Left lower extremity swollen,  tender to touch, decreased range of motion, improving over interval  Skin: No rashes or lesions, normal color Neurologic: Cranial nerves grossly intact, sensation intact, alert and oriented x3 Psychiatric: Normal affect        Data Reviewed: I have personally reviewed following labs and imaging studies  CBC: Recent Labs  Lab 12/31/19 1602 01/01/20 0146 01/01/20 1127 01/01/20 1523 01/02/20 0704 01/03/20 0735 01/04/20 0646  WBC 14.2*   < > 7.0 17.9* 10.0 8.7 8.2  NEUTROABS 10.3*  --   --   --  6.7 5.8 5.9  HGB 9.8*   < > 8.3* 8.7* 7.2* 7.4* 7.0*  HCT 31.9*   < > 28.6* 27.1* 24.5* 23.8* 23.5*  MCV 77.6*   < > 80.6 75.7* 80.6 77.5* 79.4*  PLT 120*   < > 125* 72* 69* 72* 110*   < > = values in this interval not displayed.   Basic Metabolic Panel: Recent Labs  Lab 12/31/19 1602 01/01/20 0146 01/02/20 0704 01/03/20 0735 01/04/20 0646  NA 139 138  142 138 139  K 3.7 3.9 4.4 4.3 3.9  CL 102 106 108 103 104  CO2 25 23 25 26 25   GLUCOSE 110* 107* 99 97 89  BUN 12 12 11 10 14   CREATININE 0.79 0.58 0.57 0.67 0.73  CALCIUM 9.0 8.5* 8.5* 9.0 9.3   GFR: Estimated Creatinine Clearance: 135.6 mL/min (by C-G formula based on SCr of 0.73 mg/dL). Liver Function Tests: Recent Labs  Lab 12/31/19 1602  AST 17  ALT 14  ALKPHOS 63  BILITOT 0.3  PROT 8.1  ALBUMIN 4.2   No results for input(s): LIPASE, AMYLASE in the last 168 hours. No results for input(s): AMMONIA in the last 168 hours. Coagulation Profile: Recent Labs  Lab 12/31/19 1602  INR 1.1   Cardiac Enzymes: No results for input(s): CKTOTAL, CKMB, CKMBINDEX, TROPONINI in the last 168 hours. BNP (last 3 results) No results for input(s): PROBNP in the last 8760 hours. HbA1C: No results for input(s): HGBA1C in the last 72 hours. CBG: No results for input(s): GLUCAP in the last 168 hours. Lipid Profile: No results for input(s): CHOL, HDL, LDLCALC, TRIG, CHOLHDL, LDLDIRECT in the last 72 hours. Thyroid Function  Tests: No results for input(s): TSH, T4TOTAL, FREET4, T3FREE, THYROIDAB in the last 72 hours. Anemia Panel: No results for input(s): VITAMINB12, FOLATE, FERRITIN, TIBC, IRON, RETICCTPCT in the last 72 hours. Sepsis Labs: Recent Labs  Lab 12/31/19 1602 12/31/19 2141  LATICACIDVEN 1.3 2.0*    Recent Results (from the past 240 hour(s))  SARS Coronavirus 2 by RT PCR (hospital order, performed in [facility] hospital lab) Nasopharyngeal Nasopharyngeal Swab     Status: None   Collection Time: 12/31/19  5:57 PM   Specimen: Nasopharyngeal Swab  Result Value Ref Range Status   SARS Coronavirus 2 NEGATIVE NEGATIVE Final    Comment: (NOTE) SARS-CoV-2 target nucleic acids are NOT DETECTED.  The SARS-CoV-2 RNA is generally detectable in upper and lower respiratory specimens during the acute phase of infection. The lowest concentration of SARS-CoV-2 viral copies this assay can detect is 250 copies / mL. A negative result does not preclude SARS-CoV-2 infection and should not be used as the sole basis for treatment or other patient management decisions.  A negative result may occur with improper specimen collection / handling, submission of specimen other than nasopharyngeal swab, presence of viral mutation(s) within the areas targeted by this assay, and inadequate number of viral copies (<250 copies / mL). A negative result must be combined with clinical observations, patient history, and epidemiological information.  Fact Sheet for Patients:   [facility]  Fact Sheet for Healthcare Providers: 03/01/20  This test is not yet approved or  cleared by the [employer] FDA and has been authorized for detection and/or diagnosis of SARS-CoV-2 by FDA under an Emergency Use Authorization (EUA).  This EUA will remain in effect (meaning this test can be used) for the duration of the COVID-19 declaration under Section 564(b)(1) of the  Act, 21 U.S.C. section 360bbb-3(b)(1), unless the authorization is terminated or revoked sooner.  Performed at [facility], [address] [place], [address] [place]   MRSA PCR Screening     Status: None   Collection Time: 01/01/20  2:30 PM   Specimen: Nasopharyngeal  Result Value Ref Range Status   MRSA by PCR NEGATIVE NEGATIVE Final    Comment:        The GeneXpert MRSA Assay (FDA approved for NASAL specimens only), is one component of a comprehensive MRSA  colonization surveillance program. It is not intended to diagnose MRSA infection nor to guide or monitor treatment for MRSA infections. Performed at [facility], [address] [place], [place] [address]          Radiology Studies: No results found.      Scheduled Meds: . apixaban  10 mg Oral BID   Followed by  . [START ON 01/09/2020] apixaban  5 mg Oral BID  . gabapentin  300 mg Oral TID  . ketorolac  15 mg Intravenous Q6H   Continuous Infusions: . sodium chloride Stopped (01/01/20 1300)     LOS: 3 days    Time spent: 25 minutes    Tresa Moore, MD [facility] Pager [phone]  If 7PM-7AM, please contact night-coverage 01/04/2020, 1:40 PM

## 2020-01-05 LAB — CBC WITH DIFFERENTIAL/PLATELET
Abs Immature Granulocytes: 0.05 10*3/uL (ref 0.00–0.07)
Basophils Absolute: 0.1 10*3/uL (ref 0.0–0.1)
Basophils Relative: 1 %
Eosinophils Absolute: 0.3 10*3/uL (ref 0.0–0.5)
Eosinophils Relative: 4 %
HCT: 23.2 % — ABNORMAL LOW (ref 36.0–46.0)
Hemoglobin: 7 g/dL — ABNORMAL LOW (ref 12.0–15.0)
Immature Granulocytes: 1 %
Lymphocytes Relative: 19 %
Lymphs Abs: 1.6 10*3/uL (ref 0.7–4.0)
MCH: 23.3 pg — ABNORMAL LOW (ref 26.0–34.0)
MCHC: 30.2 g/dL (ref 30.0–36.0)
MCV: 77.1 fL — ABNORMAL LOW (ref 80.0–100.0)
Monocytes Absolute: 0.4 10*3/uL (ref 0.1–1.0)
Monocytes Relative: 5 %
Neutro Abs: 5.8 10*3/uL (ref 1.7–7.7)
Neutrophils Relative %: 70 %
Platelets: 136 10*3/uL — ABNORMAL LOW (ref 150–400)
RBC: 3.01 MIL/uL — ABNORMAL LOW (ref 3.87–5.11)
RDW: 18.6 % — ABNORMAL HIGH (ref 11.5–15.5)
WBC: 8.3 10*3/uL (ref 4.0–10.5)
nRBC: 0.2 % (ref 0.0–0.2)

## 2020-01-05 LAB — HEMOGLOBIN AND HEMATOCRIT, BLOOD
HCT: 25.2 % — ABNORMAL LOW (ref 36.0–46.0)
Hemoglobin: 7.7 g/dL — ABNORMAL LOW (ref 12.0–15.0)

## 2020-01-05 LAB — BASIC METABOLIC PANEL
Anion gap: 11 (ref 5–15)
BUN: 16 mg/dL (ref 6–20)
CO2: 25 mmol/L (ref 22–32)
Calcium: 9.2 mg/dL (ref 8.9–10.3)
Chloride: 103 mmol/L (ref 98–111)
Creatinine, Ser: 0.59 mg/dL (ref 0.44–1.00)
GFR calc Af Amer: >60 mL/min (ref >60)
GFR calc non Af Amer: >60 mL/min (ref >60)
Glucose, Bld: 92 mg/dL (ref 70–99)
Potassium: 4.4 mmol/L (ref 3.5–5.1)
Sodium: 139 mmol/L (ref 135–145)

## 2020-01-05 LAB — PREPARE RBC (CROSSMATCH)

## 2020-01-05 MED ORDER — SODIUM CHLORIDE 0.9% IV SOLUTION: Freq: Once | INTRAVENOUS | Status: AC

## 2020-01-05 NOTE — Progress Notes (Signed)
[facility]  Daily Progress Note   Subjective  -   Leg is feeling better. Still with significant swelling and some pain but it is improving.  Objective Vitals:   01/05/20 0419 01/05/20 1136 01/05/20 1205 01/05/20 1402  BP: 124/81 140/84 126/76 135/71  Pulse: 99 95 94 93  Resp: 20 16 17 16   Temp: 98.5 F (36.9 C) 98.5 F (36.9 C) 98.8 F (37.1 C) 98 F (36.7 C)  TempSrc: Oral Oral Oral Oral  SpO2: 98% 100% 100% 100%  Weight:      Height:        Intake/Output Summary (Last 24 hours) at 01/05/2020 1623 Last data filed at 01/05/2020 1500 Gross per 24 hour  Intake 755 ml  Output 1050 ml  Net -295 ml    PULM  CTAB CV  RRR VASC  2+ left lower extremity edema, 1+ right lower extremity edema.  Laboratory CBC    Component Value Date/Time   WBC 8.3 01/05/2020 0543   HGB 7.7 (L) 01/05/2020 1511   HCT 25.2 (L) 01/05/2020 1511   PLT 136 (L) 01/05/2020 0543    BMET    Component Value Date/Time   NA 139 01/05/2020 0543   K 4.4 01/05/2020 0543   CL 103 01/05/2020 0543   CO2 25 01/05/2020 0543   GLUCOSE 92 01/05/2020 0543   BUN 16 01/05/2020 0543   CREATININE 0.59 01/05/2020 0543   CALCIUM 9.2 01/05/2020 0543   GFRNONAA >60 01/05/2020 0543   GFRAA >60 01/05/2020 0543    Assessment/Planning: POD #4 s/p extensive venous thrombectomy of the left lower extremity and IVC   Left leg is still swollen and painful but she is mobilizing.  Hemoglobin is stable at 7.7. Started hemoglobin was only 9 and there was some significant blood loss with the procedure. Does not appear to have ongoing bleeding. Would not recommend any further invasive therapy or thrombectomy at this point.  Recommend compression stockings, leg elevation, and increasing activity.  Okay to discharge home tomorrow from my standpoint and follow-up with vascular in 2 to 3 weeks.  Very important that she remains on anticoagulation and likely lifelong at this point. She should also  wear compression stockings daily and elevate her legs.    01/07/2020  01/05/2020, 4:23 PM

## 2020-01-05 NOTE — Clinical Social Work Note (Signed)
CSW acknowledges consult for home health/DME needs and patient's inability to afford Eliquis. Patient has no insurance. A 30-day free card will only work once for an initial prescription. Left voicemail at [facility] to see if they would be able to fill it. Patient is an Sports administrator. Unsure how she will be able to pay after she leaves or the one-time 30-day free card is used.  Charlynn Court, CSW [phone]

## 2020-01-05 NOTE — Progress Notes (Signed)
PROGRESS NOTE    Caroline Barry  KYH:[id number] DOB: [birth year] DOA: 12/31/2019 PCP: Patient, No Pcp Per  Brief Narrative:  HPI: Caroline Barry is a 24 y.o. female with medical history significant for PE, central venous thrombosis, migraine, tobacco use who presents with concern of left leg edema and increased pain.  Patient says that for the past 2 days she has noticed increased asymmetric swelling of her left lower leg.  Also began to notice painful cramps from her hip down to her knees.  Also from knee down to her feet she feels numbness and tingling.  She has a hard time walking due to the pain behind her knees.  Patient uses tobacco and smokes cigars.  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  She had history of PE last year and a central venous thrombus about 2 years ago.  She was never told the reasoning for this.  She was started on Eliquis but stopped due to financial constraints.  Now she only takes aspirin occasionally.  She denies any chest pain or shortness of breath which she has felt before during her prior PE.  She denies any use of hormonal birth control.  Denies any family history of clotting disorders.  8/12: Patient seen and examined.  Hemodynamically stable.  Complains of pain in left leg.  Communicated with Dr. Wyn Quaker.  Plan for mechanical thrombectomy today.  8/13: Patient seen and examined.  Status post mechanical thrombectomy with vascular surgery yesterday.  Also underwent catheter directed thrombolysis given extensive clot burden of left lower extremity.  This morning left leg discomfort improved.  Postoperatively required admission to the intensive care unit and short period of time on vasopressor support for hypotension.  Now recovered and off all pressors.  Stable for discharge out of ICU.  8/14: Patient seen and examined.  Still with some pain in the left leg.  Left leg still swollen and tender to touch.  Remains  on Eliquis for anticoagulation.  hemoglobin to 7.4 noted this morning.  Stable over interval.  Platelet count actually improving.  Metabolic profile within normal limits.  Vital signs stable.  No decompensating events overnight.  8/15: Patient seen and examined.  Still with pain in left leg.  Left leg remains swollen and tense.  Working physical therapy.  Progressing slowly.  Hemoglobin 7.0 this morning.  Platelet count improving to 110.  Metabolic profile largely within normal limits.  No decompensating events overnight.  8/16: Patient seen and examined.  Pain in left leg improving.  Left leg still swollen and edematous.  Improving ambulation with physical therapy.  Hemoglobin remains at 7.  Platelet count improving.  Metabolic profile unrevealing.  No decompensating events overnight.   Assessment & Plan:   Principal Problem:   DVT (deep venous thrombosis) (HCC) Active Problems:   Pulmonary embolism (HCC)   Microcytic anemia   Thrombocytopenia (HCC)  DVT of the left lower extremity Ultrasound shows DVT of all deep veins in the left lower extremity Dr. Wyn Quaker, vascular surgery on consult Mechanical thrombectomy 8/12 Required short ICU stay for vasopressor support postoperatively Transferred back to MedSurg 01/02/2020 IV heparin stopped, Eliquis started Plan: Continue Eliquis 10 mg twice daily x7 days, followed by 5 mg twice daily indefinitely As needed pain control Case management aware.  Patient was previously on Eliquis but stopped due to financial constraints Patient still with substantial swelling in the area.  Will encourage to mobilize.  Up in chair as tolerated.  Physical therapy consulted.  Left lower lobe pulmonary embolism No evidence of right heart strain P.o. Eliquis initiated as above  Defer echocardiogram for now  Microcytic anemia, worsening Stable and last hemoglobin a year ago was about 9 Will check iron panel Suspect chronic iron deficiency anemia Hemoglobin 7  this morning.  Patient was endorsing some nonspecific symptoms that could be due to anemia.  Will transfuse 1 unit packed red blood cells.  Posttransfusion H&H and repeat CBC in the morning.  Thrombocytopenia Likely secondary to significant DVT and PE.   Relatively stable No bleeding noted Transitioned to Eliquis Check daily CBC  Tobacco use Discussed cessation especially with her significant history of DVTs and PE  Morbid obesity BMI 41.9 Counseled patient    DVT prophylaxis: Eliquis Code Status: Full Family Communication: None today Disposition Plan: Status is: Inpatient  Remains inpatient appropriate because:Inpatient level of care appropriate due to severity of illness   Dispo: The patient is from: Home              Anticipated d/c is to: Home              Anticipated d/c date is: 2 days              Patient currently is not medically stable to d/c.  Currently POD #4.  Remains on Eliquis.  Anticipate return to previous home environment.  Will need financial assistance for Eliquis.  Case management involved.  If we can get patient a little financial assistance for Eliquis and confirmation the hemoglobin is stable she can potentially discharge home tomorrow.  Consultants:   Vascular surgery  Procedures:   Mechanical thrombectomy, 01/01/20  Antimicrobials:   None   Subjective: Seen and examined.  Complains of pain left leg.  Improving over interval Objective: Vitals:   01/05/20 0419 01/05/20 1136 01/05/20 1205 01/05/20 1402  BP: 124/81 140/84 126/76 135/71  Pulse: 99 95 94 93  Resp: 20 16 17 16   Temp: 98.5 F (36.9 C) 98.5 F (36.9 C) 98.8 F (37.1 C) 98 F (36.7 C)  TempSrc: Oral Oral Oral Oral  SpO2: 98% 100% 100% 100%  Weight:      Height:        Intake/Output Summary (Last 24 hours) at 01/05/2020 1451 Last data filed at 01/05/2020 1359 Gross per 24 hour  Intake 755 ml  Output 750 ml  Net 5 ml   Filed Weights   12/31/19 1425 12/31/19 2124    Weight: 63 kg 114.2 kg    Examination:  General: No apparent distress, patient appears well HEENT: Normocephalic, atraumatic Neck, supple, trachea midline, no tenderness Heart: Regular rate and rhythm, S1/S2 normal, no murmurs Lungs: Clear to auscultation bilaterally, no adventitious sounds, normal work of breathing Abdomen: Soft, nontender, nondistended, positive bowel sounds Extremities: : Left lower extremity swollen, tender to touch, decreased range of motion, improving over interval Skin: No rashes or lesions, normal color Neurologic: Cranial nerves grossly intact, sensation intact, alert and oriented x3 Psychiatric: Normal affect         Data Reviewed: I have personally reviewed following labs and imaging studies  CBC: Recent Labs  Lab 12/31/19 1602 01/01/20 0146 01/01/20 1523 01/02/20 0704 01/03/20 0735 01/04/20 0646 01/05/20 0543  WBC 14.2*   < > 17.9* 10.0 8.7 8.2 8.3  NEUTROABS 10.3*  --   --  6.7 5.8 5.9 5.8  HGB 9.8*   < > 8.7* 7.2* 7.4* 7.0* 7.0*  HCT 31.9*   < >  27.1* 24.5* 23.8* 23.5* 23.2*  MCV 77.6*   < > 75.7* 80.6 77.5* 79.4* 77.1*  PLT 120*   < > 72* 69* 72* 110* 136*   < > = values in this interval not displayed.   Basic Metabolic Panel: Recent Labs  Lab 01/01/20 0146 01/02/20 0704 01/03/20 0735 01/04/20 0646 01/05/20 0543  NA 138 142 138 139 139  K 3.9 4.4 4.3 3.9 4.4  CL 106 108 103 104 103  CO2 23 25 26 25 25   GLUCOSE 107* 99 97 89 92  BUN 12 11 10 14 16   CREATININE 0.58 0.57 0.67 0.73 0.59  CALCIUM 8.5* 8.5* 9.0 9.3 9.2   GFR: Estimated Creatinine Clearance: 135.6 mL/min (by C-G formula based on SCr of 0.59 mg/dL). Liver Function Tests: Recent Labs  Lab 12/31/19 1602  AST 17  ALT 14  ALKPHOS 63  BILITOT 0.3  PROT 8.1  ALBUMIN 4.2   No results for input(s): LIPASE, AMYLASE in the last 168 hours. No results for input(s): AMMONIA in the last 168 hours. Coagulation Profile: Recent Labs  Lab 12/31/19 1602  INR 1.1    Cardiac Enzymes: No results for input(s): CKTOTAL, CKMB, CKMBINDEX, TROPONINI in the last 168 hours. BNP (last 3 results) No results for input(s): PROBNP in the last 8760 hours. HbA1C: No results for input(s): HGBA1C in the last 72 hours. CBG: No results for input(s): GLUCAP in the last 168 hours. Lipid Profile: No results for input(s): CHOL, HDL, LDLCALC, TRIG, CHOLHDL, LDLDIRECT in the last 72 hours. Thyroid Function Tests: No results for input(s): TSH, T4TOTAL, FREET4, T3FREE, THYROIDAB in the last 72 hours. Anemia Panel: No results for input(s): VITAMINB12, FOLATE, FERRITIN, TIBC, IRON, RETICCTPCT in the last 72 hours. Sepsis Labs: Recent Labs  Lab 12/31/19 1602 12/31/19 2141  LATICACIDVEN 1.3 2.0*    Recent Results (from the past 240 hour(s))  SARS Coronavirus 2 by RT PCR (hospital order, performed in [facility] lab) Nasopharyngeal Nasopharyngeal Swab     Status: None   Collection Time: 12/31/19  5:57 PM   Specimen: Nasopharyngeal Swab  Result Value Ref Range Status   SARS Coronavirus 2 NEGATIVE NEGATIVE Final    Comment: (NOTE) SARS-CoV-2 target nucleic acids are NOT DETECTED.  The SARS-CoV-2 RNA is generally detectable in upper and lower respiratory specimens during the acute phase of infection. The lowest concentration of SARS-CoV-2 viral copies this assay can detect is 250 copies / mL. A negative result does not preclude SARS-CoV-2 infection and should not be used as the sole basis for treatment or other patient management decisions.  A negative result may occur with improper specimen collection / handling, submission of specimen other than nasopharyngeal swab, presence of viral mutation(s) within the areas targeted by this assay, and inadequate number of viral copies (<250 copies / mL). A negative result must be combined with clinical observations, patient history, and epidemiological information.  Fact Sheet for Patients:    BoilerBrush.com.cyhttps://www.fda.gov/media/136312/download  Fact Sheet for Healthcare Providers: https://pope.com/https://www.fda.gov/media/136313/download  This test is not yet approved or  cleared by the [place] FDA and has been authorized for detection and/or diagnosis of SARS-CoV-2 by FDA under an Emergency Use Authorization (EUA).  This EUA will remain in effect (meaning this test can be used) for the duration of the COVID-19 declaration under Section 564(b)(1) of the Act, 21 U.S.C. section 360bbb-3(b)(1), unless the authorization is terminated or revoked sooner.  Performed at [facility], [address] [place], [place] [address]  MRSA PCR Screening     Status: None   Collection Time: 01/01/20  2:30 PM   Specimen: Nasopharyngeal  Result Value Ref Range Status   MRSA by PCR NEGATIVE NEGATIVE Final    Comment:        The GeneXpert MRSA Assay (FDA approved for NASAL specimens only), is one component of a comprehensive MRSA colonization surveillance program. It is not intended to diagnose MRSA infection nor to guide or monitor treatment for MRSA infections. Performed at [facility], [address] [place], [place] [address]          Radiology Studies: No results found.      Scheduled Meds: . apixaban  10 mg Oral BID   Followed by  . [START ON 01/09/2020] apixaban  5 mg Oral BID  . gabapentin  300 mg Oral TID  . ketorolac  15 mg Intravenous Q6H   Continuous Infusions: . sodium chloride Stopped (01/01/20 1300)     LOS: 4 days    Time spent: 25 minutes    Tresa Moore, MD [facility] Pager [phone]  If 7PM-7AM, please contact night-coverage 01/05/2020, 2:51 PM

## 2020-01-05 NOTE — Progress Notes (Signed)
PT Cancellation Note  Patient Details Name: Caroline Barry MRN: [id number] DOB: 27-Feb-1996   Cancelled Treatment:    Reason Eval/Treat Not Completed: Other (comment)    Arrived to room for session.  Pt just returned from self ambulation to and from bathroom.  Stated she has been walking on her own in room.  HR noted to be 145 upon sitting and decreased slowly with rest.  She declined further gait at this time but did report some concern over getting into her truck upon discharge.  Plans to drive it home with her mom or sister when they arrive.  Unsure how to simulate climbing into large rig but may consider at least stair training to simulate stepping and single leg stance activities in preparation.   Danielle Dess 01/05/2020, 3:50 PM

## 2020-01-06 LAB — TYPE AND SCREEN
ABO/RH(D): A POS
Antibody Screen: NEGATIVE
Unit division: 0

## 2020-01-06 LAB — CBC WITH DIFFERENTIAL/PLATELET
Abs Immature Granulocytes: 0.03 10*3/uL (ref 0.00–0.07)
Basophils Absolute: 0.1 10*3/uL (ref 0.0–0.1)
Basophils Relative: 1 %
Eosinophils Absolute: 0.4 10*3/uL (ref 0.0–0.5)
Eosinophils Relative: 4 %
HCT: 25.5 % — ABNORMAL LOW (ref 36.0–46.0)
Hemoglobin: 8.1 g/dL — ABNORMAL LOW (ref 12.0–15.0)
Immature Granulocytes: 0 %
Lymphocytes Relative: 21 %
Lymphs Abs: 1.9 10*3/uL (ref 0.7–4.0)
MCH: 24.7 pg — ABNORMAL LOW (ref 26.0–34.0)
MCHC: 31.8 g/dL (ref 30.0–36.0)
MCV: 77.7 fL — ABNORMAL LOW (ref 80.0–100.0)
Monocytes Absolute: 0.6 10*3/uL (ref 0.1–1.0)
Monocytes Relative: 6 %
Neutro Abs: 6.3 10*3/uL (ref 1.7–7.7)
Neutrophils Relative %: 68 %
Platelets: 134 10*3/uL — ABNORMAL LOW (ref 150–400)
RBC: 3.28 MIL/uL — ABNORMAL LOW (ref 3.87–5.11)
RDW: 18.5 % — ABNORMAL HIGH (ref 11.5–15.5)
WBC: 9.2 10*3/uL (ref 4.0–10.5)
nRBC: 0.2 % (ref 0.0–0.2)

## 2020-01-06 LAB — BASIC METABOLIC PANEL
Anion gap: 9 (ref 5–15)
BUN: 19 mg/dL (ref 6–20)
CO2: 25 mmol/L (ref 22–32)
Calcium: 9.1 mg/dL (ref 8.9–10.3)
Chloride: 103 mmol/L (ref 98–111)
Creatinine, Ser: 0.77 mg/dL (ref 0.44–1.00)
GFR calc Af Amer: >60 mL/min (ref >60)
GFR calc non Af Amer: >60 mL/min (ref >60)
Glucose, Bld: 103 mg/dL — ABNORMAL HIGH (ref 70–99)
Potassium: 4.5 mmol/L (ref 3.5–5.1)
Sodium: 137 mmol/L (ref 135–145)

## 2020-01-06 LAB — BPAM RBC
Blood Product Expiration Date: 202109022359
ISSUE DATE / TIME: 202108161142
Unit Type and Rh: 6200

## 2020-01-06 MED ORDER — APIXABAN 5 MG PO TABS: 5.0000 mg | ORAL_TABLET | Freq: Two times a day (BID) | ORAL | 0 refills | Status: DC

## 2020-01-06 MED ORDER — GABAPENTIN 300 MG PO CAPS: 300.0000 mg | ORAL_CAPSULE | Freq: Two times a day (BID) | ORAL | 0 refills | Status: AC | PRN

## 2020-01-06 MED ORDER — APIXABAN 5 MG PO TABS: 5.0000 mg | ORAL_TABLET | Freq: Two times a day (BID) | ORAL | 0 refills | Status: AC

## 2020-01-06 MED ORDER — ACETAMINOPHEN 325 MG PO TABS: 650.0000 mg | ORAL_TABLET | Freq: Four times a day (QID) | ORAL | Status: AC | PRN

## 2020-01-06 NOTE — Progress Notes (Signed)
Mobility Specialist - Progress Note   01/06/20 1322  Mobility  Activity Ambulated in hall  Level of Assistance Modified independent, requires aide device or extra time  Assistive Device Front wheel walker  Distance Ambulated (ft) 190 ft  Mobility Response Tolerated well  Mobility performed by Mobility specialist  $Mobility charge 1 Mobility    Pre-mobility: 91 HR, 145/88 BP, 100% SpO2 During mobility: 126 HR, 98% SpO2 Post-mobility: 94 HR, 153/85 BP, 100% SPO2   Pt was sitting EOB upon arrival, stating that she "had just finished her exercises". Pt agreed to session. Pt c/o of pain and little swelling in her L leg, but was motivated to walk and "getting better for work". Pt's HR was closely monitored throughout session. After 55', the first standing break was taken, HR sat at 126bpm. After getting the HR back down, pt continued session. Pt ambulated another 60', when a second standing break was taken and HR sat at 131bpm. Pt waited 2 mins before HR decreased to 110. No noted heavy breathing or SOB during session. Pt denied any dizziness/fatigue. Pt stated that she "usually feels a tightness in her L foot after walking". Overall, pt tolerated session well. Nurse was notified of performance.    Filiberto Pinks Mobility Specialist 01/06/20, 1:50 PM

## 2020-01-06 NOTE — Progress Notes (Signed)
Caroline Barry  A and O x 4. VSS. Pt tolerating diet well. No complaints of pain or nausea. IV removed intact, prescriptions given. Pt voiced understanding of discharge instructions with no further questions. Pt discharged via wheelchair with axillary.    Allergies as of 01/06/2020   No Known Allergies     Medication List    TAKE these medications   acetaminophen 325 MG tablet Commonly known as: TYLENOL Take 2 tablets (650 mg total) by mouth every 6 (six) hours as needed for mild pain or headache.   apixaban 5 MG Tabs tablet Commonly known as: ELIQUIS Take 1 tablet (5 mg total) by mouth 2 (two) times daily.   gabapentin 300 MG capsule Commonly known as: NEURONTIN Take 1 capsule (300 mg total) by mouth 2 (two) times daily as needed for up to 14 days.            Durable Medical Equipment  (From admission, onward)         Start     Ordered   01/06/20 0802  For home use only DME 3 n 1  Once        01/06/20 0801   01/06/20 0801  For home use only DME Walker rolling  Once       Question Answer Comment  Walker: With 5 Inch Wheels   Patient needs a walker to treat with the following condition Weakness      01/06/20 0801          Vitals:   01/05/20 1922 01/06/20 0459  BP: 137/83 124/69  Pulse: 92 83  Resp: 20 20  Temp: (!) 97.5 F (36.4 C) 98.6 F (37 C)  SpO2: 100% 100%    Suzzanne Cloud

## 2020-01-06 NOTE — Discharge Summary (Signed)
Physician Discharge Summary  [facility] ZOX:[id number] DOB: May 17, 1996 DOA: 12/31/2019  PCP: Patient, No Pcp Per  Admit date: 12/31/2019 Discharge date: 01/06/2020  Admitted From: Home Disposition: Home  Recommendations for Outpatient Follow-up:  1. Follow up with PCP in 1-2 weeks 2.   Home Health no Equipment/Devices none Discharge Condition: Stable CODE STATUS: Full Diet recommendation: Heart Healthy Brief/Interim Summary: WJX:BJYNWGNFAOZHPI:Turkessa Washingtonis a 24 y.o.femalewith medical history significant forPE, central venous thrombosis, migraine, tobacco use who presents with concern of left leg edema and increased pain.  Patient says that for the past 2 days she has noticed increased asymmetric swelling of her left lower leg. Also began to notice painful cramps from her hip down to her knees. Also from knee down to her feet she feels numbness and tingling. She has a hard time walking due to the pain behind her knees. Patient uses tobacco and smokes cigars. 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 She had history of PE last year and a central venous thrombus about 2 years ago. She was never told the reasoning for this. She was started on Eliquis but stopped due to financial constraints. Now she only takes aspirin occasionally. She denies any chest pain or shortness of breath which she has felt before during her prior PE. She denies any use of hormonal birth control. Denies any family history of clotting disorders.  8/12: Patient seen and examined.  Hemodynamically stable.  Complains of pain in left leg.  Communicated with Dr. Wyn Quakerew.  Plan for mechanical thrombectomy today.  8/13: Patient seen and examined.  Status post mechanical thrombectomy with vascular surgery yesterday.  Also underwent catheter directed thrombolysis given extensive clot burden of left lower extremity.  This morning left leg discomfort improved.   Postoperatively required admission to the intensive care unit and short period of time on vasopressor support for hypotension.  Now recovered and off all pressors.  Stable for discharge out of ICU.  8/14: Patient seen and examined.  Still with some pain in the left leg.  Left leg still swollen and tender to touch.  Remains on Eliquis for anticoagulation.  hemoglobin to 7.4 noted this morning.  Stable over interval.  Platelet count actually improving.  Metabolic profile within normal limits.  Vital signs stable.  No decompensating events overnight.  8/15: Patient seen and examined.  Still with pain in left leg.  Left leg remains swollen and tense.  Working physical therapy.  Progressing slowly.  Hemoglobin 7.0 this morning.  Platelet count improving to 110.  Metabolic profile largely within normal limits.  No decompensating events overnight.  8/16: Patient seen and examined.  Pain in left leg improving.  Left leg still swollen and edematous.  Improving ambulation with physical therapy.  Hemoglobin remains at 7.  Platelet count improving.  Metabolic profile unrevealing.  No decompensating events overnight.  8/17: Stable for discharge.  Will prescribe 1 month of Eliquis with free coupon.  Patient will need lifelong anticoagulation.  Hemoglobin stable after transfusion.  Platelet count improving.  Stable for discharge home.  Clear return to ED instructions provided.  Discharge Diagnoses:  Principal Problem:   DVT (deep venous thrombosis) (HCC) Active Problems:   Pulmonary embolism (HCC)   Microcytic anemia   Thrombocytopenia (HCC)    Discharge Instructions  Discharge Instructions    Diet - low sodium heart healthy   Complete by: As directed    Increase activity slowly   Complete by: As directed  Allergies as of 01/06/2020   No Known Allergies     Medication List    TAKE these medications   acetaminophen 325 MG tablet Commonly known as: TYLENOL Take 2 tablets (650 mg total) by  mouth every 6 (six) hours as needed for mild pain or headache.   apixaban 5 MG Tabs tablet Commonly known as: ELIQUIS Take 1 tablet (5 mg total) by mouth 2 (two) times daily.   gabapentin 300 MG capsule Commonly known as: NEURONTIN Take 1 capsule (300 mg total) by mouth 2 (two) times daily as needed for up to 14 days.            Durable Medical Equipment  (From admission, onward)         Start     Ordered   01/06/20 0802  For home use only DME 3 n 1  Once        01/06/20 0801   01/06/20 0801  For home use only DME Walker rolling  Once       Question Answer Comment  Walker: With 5 Inch Wheels   Patient needs a walker to treat with the following condition Weakness      01/06/20 0801          Follow-up Information    Dew, Marlow Baars, MD. Go on 01/20/2020.   Specialties: Vascular Surgery, Radiology, Interventional Cardiology Why: appointment is at 1pm  Contact information: [address] [place] [address] [phone]              No Known Allergies  Consultations:  Vascular surgery   Procedures/Studies: CT ANGIO CHEST PE W OR WO CONTRAST  Result Date: 12/31/2019 CLINICAL DATA:  24 year old female with concern for pulmonary embolism. EXAM: CT ANGIOGRAPHY CHEST WITH CONTRAST TECHNIQUE: Multidetector CT imaging of the chest was performed using the standard protocol during bolus administration of intravenous contrast. Multiplanar CT image reconstructions and MIPs were obtained to evaluate the vascular anatomy. CONTRAST:  27mL OMNIPAQUE IOHEXOL 350 MG/ML SOLN COMPARISON:  None. FINDINGS: Cardiovascular: There is no cardiomegaly or pericardial effusion. The thoracic aorta is unremarkable. The origins of the great vessels of the aortic arch appear patent. Evaluation of the pulmonary arteries is very limited due to suboptimal opacification and timing of the contrast. There is however intraluminal filling defects noted involving the left lower lobe lobar and segmental  branches consistent with pulmonary emboli. No CT evidence of right heart straining. Mediastinum/Nodes: No hilar or mediastinal adenopathy. The esophagus and the thyroid gland are grossly unremarkable. No mediastinal fluid collection. Lungs/Pleura: Minimal right lung base atelectasis. No focal consolidation, or pneumothorax. Probable trace right pleural effusion. There is a 4 mm right upper lobe nodule (36/6), likely a partially calcified granuloma. The central airways are patent. Upper Abdomen: No acute abnormality. Musculoskeletal: No chest wall abnormality. No acute or significant osseous findings. Review of the MIP images confirms the above findings. IMPRESSION: Left lower lobe pulmonary emboli. No CT evidence of right heart straining. These results were called by telephone at the time of interpretation on 12/31/2019 at 8:37 pm to provider Manuela Schwartz, who verbally acknowledged these results. Electronically Signed   By: Elgie Collard M.D.   On: 12/31/2019 20:42   PERIPHERAL VASCULAR CATHETERIZATION  Result Date: 01/01/2020 See op note  US Venous Img Lower Unilateral Left  Result Date: 12/31/2019 CLINICAL DATA:  PE. EXAM: LEFT LOWER EXTREMITY VENOUS DOPPLER ULTRASOUND TECHNIQUE: Gray-scale sonography with compression, as well as color and duplex ultrasound, were performed to evaluate  the deep venous system(s) from the level of the common femoral vein through the popliteal and proximal calf veins. COMPARISON:  None. FINDINGS: VENOUS There is extensive occlusive thrombus throughout the left lower extremity including the left CFV, profundus femoral, superficial femoral vein, popliteal vein, and tibial veins. Limited views of the contralateral common femoral vein are unremarkable. OTHER Lower extremity edema is noted. Limitations: none IMPRESSION: Extensive occlusive thrombus is noted involving essentially all of the deep veins of the left lower extremity. These results were called by telephone at the  time of interpretation on 12/31/2019 at 5:15 pm to provider JENISE MENSHEW , who verbally acknowledged these results. Electronically Signed   By: Katherine Mantle M.D.   On: 12/31/2019 17:18    (Echo, Carotid, EGD, Colonoscopy, ERCP)    Subjective: Seen and examined on day of discharge.  No complaints, ambulating.  Stable for discharge  Discharge Exam: Vitals:   01/05/20 1922 01/06/20 0459  BP: 137/83 124/69  Pulse: 92 83  Resp: 20 20  Temp: (!) 97.5 F (36.4 C) 98.6 F (37 C)  SpO2: 100% 100%   Vitals:   01/05/20 1205 01/05/20 1402 01/05/20 1922 01/06/20 0459  BP: 126/76 135/71 137/83 124/69  Pulse: 94 93 92 83  Resp: 17 16 20 20   Temp: 98.8 F (37.1 C) 98 F (36.7 C) (!) 97.5 F (36.4 C) 98.6 F (37 C)  TempSrc: Oral Oral Oral Oral  SpO2: 100% 100% 100% 100%  Weight:      Height:        General: Pt is alert, awake, not in acute distress Cardiovascular: RRR, S1/S2 +, no rubs, no gallops Respiratory: CTA bilaterally, no wheezing, no rhonchi Abdominal: Soft, NT, ND, bowel sounds + Extremities: no edema, no cyanosis    The results of significant diagnostics from this hospitalization (including imaging, microbiology, ancillary and laboratory) are listed below for reference.     Microbiology: Recent Results (from the past 240 hour(s))  SARS Coronavirus 2 by RT PCR (hospital order, performed in [facility] hospital lab) Nasopharyngeal Nasopharyngeal Swab     Status: None   Collection Time: 12/31/19  5:57 PM   Specimen: Nasopharyngeal Swab  Result Value Ref Range Status   SARS Coronavirus 2 NEGATIVE NEGATIVE Final    Comment: (NOTE) SARS-CoV-2 target nucleic acids are NOT DETECTED.  The SARS-CoV-2 RNA is generally detectable in upper and lower respiratory specimens during the acute phase of infection. The lowest concentration of SARS-CoV-2 viral copies this assay can detect is 250 copies / mL. A negative result does not preclude SARS-CoV-2 infection and should  not be used as the sole basis for treatment or other patient management decisions.  A negative result may occur with improper specimen collection / handling, submission of specimen other than nasopharyngeal swab, presence of viral mutation(s) within the areas targeted by this assay, and inadequate number of viral copies (<250 copies / mL). A negative result must be combined with clinical observations, patient history, and epidemiological information.  Fact Sheet for Patients:   03/01/20  Fact Sheet for Healthcare Providers: BoilerBrush.com.cy  This test is not yet approved or  cleared by the https://pope.com/ FDA and has been authorized for detection and/or diagnosis of SARS-CoV-2 by FDA under an Emergency Use Authorization (EUA).  This EUA will remain in effect (meaning this test can be used) for the duration of the COVID-19 declaration under Section 564(b)(1) of the Act, 21 U.S.C. section 360bbb-3(b)(1), unless the authorization is terminated or revoked sooner.  Performed at [facility], [address] [place], [place] [address]   MRSA PCR Screening     Status: None   Collection Time: 01/01/20  2:30 PM   Specimen: Nasopharyngeal  Result Value Ref Range Status   MRSA by PCR NEGATIVE NEGATIVE Final    Comment:        The GeneXpert MRSA Assay (FDA approved for NASAL specimens only), is one component of a comprehensive MRSA colonization surveillance program. It is not intended to diagnose MRSA infection nor to guide or monitor treatment for MRSA infections. Performed at [facility], [address] [place], [place] [address]      Labs: BNP (last 3 results) Recent Labs    12/31/19 1602  BNP 52.3   Basic Metabolic Panel: Recent Labs  Lab 01/02/20 0704 01/03/20 0735 01/04/20 0646 01/05/20 0543 01/06/20 0506  NA 142 138 139 139 137  K 4.4 4.3 3.9 4.4 4.5  CL 108 103 104 103 103  CO2 GLUCOSE 99 97 89 92 103*  BUN CREATININE 0.57 0.67 0.73 0.59 0.77  CALCIUM 8.5* 9.0 9.3 9.2 9.1   Liver Function Tests: Recent Labs  Lab 12/31/19 1602  AST 17  ALT 14  ALKPHOS 63  BILITOT 0.3  PROT 8.1  ALBUMIN 4.2   No results for input(s): LIPASE, AMYLASE in the last 168 hours. No results for input(s): AMMONIA in the last 168 hours. CBC: Recent Labs  Lab 01/02/20 0704 01/02/20 0704 01/03/20 0735 01/04/20 0646 01/05/20 0543 01/05/20 1511 01/06/20 0506  WBC 10.0  --  8.7 8.2 8.3  --  9.2  NEUTROABS 6.7  --  5.8 5.9 5.8  --  6.3  HGB 7.2*   < > 7.4* 7.0* 7.0* 7.7* 8.1*  HCT 24.5*   < > 23.8* 23.5* 23.2* 25.2* 25.5*  MCV 80.6  --  77.5* 79.4* 77.1*  --  77.7*  PLT 69*  --  72* 110* 136*  --  134*   < > = values in this interval not displayed.   Cardiac Enzymes: No results for input(s): CKTOTAL, CKMB, CKMBINDEX, TROPONINI in the last 168 hours. BNP: Invalid input(s): POCBNP CBG: No results for input(s): GLUCAP in the last 168 hours. D-Dimer No results for input(s): DDIMER in the last 72 hours. Hgb A1c No results for input(s): HGBA1C in the last 72 hours. Lipid Profile No results for input(s): CHOL, HDL, LDLCALC, TRIG, CHOLHDL, LDLDIRECT in the last 72 hours. Thyroid function studies No results for input(s): TSH, T4TOTAL, T3FREE, THYROIDAB in the last 72 hours.  Invalid input(s): FREET3 Anemia work up No results for input(s): VITAMINB12, FOLATE, FERRITIN, TIBC, IRON, RETICCTPCT in the last 72 hours. Urinalysis    Component Value Date/Time   COLORURINE YELLOW (A) 12/31/2019 1603   APPEARANCEUR CLOUDY (A) 12/31/2019 1603   LABSPEC 1.024 12/31/2019 1603   PHURINE 5.0 12/31/2019 1603   GLUCOSEU NEGATIVE 12/31/2019 1603   HGBUR MODERATE (A) 12/31/2019 1603   BILIRUBINUR NEGATIVE 12/31/2019 1603   KETONESUR NEGATIVE 12/31/2019 1603   PROTEINUR 30 (A) 12/31/2019 1603   NITRITE NEGATIVE 12/31/2019 1603   LEUKOCYTESUR MODERATE (A)  12/31/2019 1603   Sepsis Labs Invalid input(s): PROCALCITONIN,  WBC,  LACTICIDVEN Microbiology Recent Results (from the past 240 hour(s))  SARS Coronavirus 2 by RT PCR (hospital order, performed in [facility] hospital lab) Nasopharyngeal Nasopharyngeal Swab     Status: None   Collection Time: 12/31/19  5:57 PM   Specimen: Nasopharyngeal Swab  Result Value Ref Range Status   SARS Coronavirus 2 NEGATIVE NEGATIVE Final    Comment: (NOTE) SARS-CoV-2 target nucleic acids are NOT DETECTED.  The SARS-CoV-2 RNA is generally detectable in upper and lower respiratory specimens during the acute phase of infection. The lowest concentration of SARS-CoV-2 viral copies this assay can detect is 250 copies / mL. A negative result does not preclude SARS-CoV-2 infection and should not be used as the sole basis for treatment or other patient management decisions.  A negative result may occur with improper specimen collection / handling, submission of specimen other than nasopharyngeal swab, presence of viral mutation(s) within the areas targeted by this assay, and inadequate number of viral copies (<250 copies / mL). A negative result must be combined with clinical observations, patient history, and epidemiological information.  Fact Sheet for Patients:   BoilerBrush.com.cy  Fact Sheet for Healthcare Providers: https://pope.com/  This test is not yet approved or  cleared by the [place] FDA and has been authorized for detection and/or diagnosis of SARS-CoV-2 by FDA under an Emergency Use Authorization (EUA).  This EUA will remain in effect (meaning this test can be used) for the duration of the COVID-19 declaration under Section 564(b)(1) of the Act, 21 U.S.C. section 360bbb-3(b)(1), unless the authorization is terminated or revoked sooner.  Performed at [facility], [address] [place], [place] [address]   MRSA PCR Screening      Status: None   Collection Time: 01/01/20  2:30 PM   Specimen: Nasopharyngeal  Result Value Ref Range Status   MRSA by PCR NEGATIVE NEGATIVE Final    Comment:        The GeneXpert MRSA Assay (FDA approved for NASAL specimens only), is one component of a comprehensive MRSA colonization surveillance program. It is not intended to diagnose MRSA infection nor to guide or monitor treatment for MRSA infections. Performed at [facility], [address] [place], [place] [address]      Time coordinating discharge: Over 30 minutes  SIGNED:   Tresa Moore, MD  [facility] 01/06/2020, 3:31 PM Pager   If 7PM-7AM, please contact night-coverage

## 2020-01-06 NOTE — Progress Notes (Signed)
Physical Therapy Treatment Patient Details Name: Caroline Barry MRN: [id number] DOB: 1996-03-09 Today's Date: 01/06/2020    History of Present Illness Caroline Barry is a 24 y.o. female with medical history significant for PE (last yr), central venous thrombosis, migraine, tobacco use who presents with left leg edema and increased pain from hip to knees (LLE), difficulty walking d/t knee pain (posteriorly). Was started on Eliquis but DCed due to financial constraints. Imaging revealed extensive thrombus of all deep veins of LLE, L lower lobe PE. Pt s/p thrombolysis and mechanical thrombectomy (8/12) involving L superficial femoral, common femoral, external and common iliac veins, and IVC; as well as PTA of L superficial femoral, common femoral, L external and common iliac veins.  No pulmonary intervention recommended at this time per vascular consult with exception of Eliquis ~1 year.    PT Comments    Pt was independently up ambulating in BR upon arriving. She greets therapist and is agreeable to session. " I'm ready to get out of here." Resting HR 124 bpm. 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 She was easily able to ambulate 100 ft with bariatric RW and performed stairs however HR after performing stairs 166 bpm. Pt endorses fatigue and returned to room for seated rest. After ~ 3 minutes resting HR down to 118 bpm. Overall pt is limited by fatigue. She was seated EOB at conclusion of session requesting to take shower. Therapist recommends pt consider cardiac/pulonmary rehab when able to improve cardiopulmonary function. Safe to DC from a mobility/PT standpoint.      Follow Up Recommendations  Other (comment) (cardiac rehab? )     Equipment Recommendations  Rolling walker with 5" wheels (bariatric due to body mass)    Recommendations for Other Services       Precautions / Restrictions Precautions Precautions:  Fall Restrictions Weight Bearing Restrictions: No    Mobility  Bed Mobility               General bed mobility comments: pt was in BR upon arriving and seated EOB post session.  Transfers Overall transfer level: Independent Equipment used: Rolling walker (2 wheeled) (bariatric) Transfers: Sit to/from Stand Sit to Stand: Independent         General transfer comment: pt was I'ly ambulating in room and states she was able to get OOB and on/off toilet without difficulty.  Ambulation/Gait Ambulation/Gait assistance: Supervision Gait Distance (Feet): 100 Feet Assistive device: Rolling walker (2 wheeled) (Bariatric)   Gait velocity: decreased   General Gait Details: pt HR elevated to 166bpm after ambulationa nd performing stairs. no LOB or unsteadiness however fatigue noted with minimal distances   Stairs Stairs: Yes Stairs assistance: Supervision Stair Management: Two rails;Step to pattern Number of Stairs: 3 General stair comments: pt has to be able to gho up/down 3 steps to enter truck (lives in). she has grab bars on both sides and was able to easily perform. HR elevated to 166bpm after performing stairs.    Wheelchair Mobility    Modified Rankin (Stroke Patients Only)       Balance Overall balance assessment: Modified Independent Sitting-balance support: No upper extremity supported;Feet supported Sitting balance-Leahy Scale: Normal     Standing balance support: During functional activity;Bilateral upper extremity supported Standing balance-Leahy Scale: Good Standing balance comment: no LOB while using RW during ambulation. Pt able to static stand without UE support to get dressed/donn mask without instability.  Cognition Arousal/Alertness: Awake/alert Behavior During Therapy: WFL for tasks assessed/performed Overall Cognitive Status: Within Functional Limits for tasks assessed                                  General Comments: A&Ox4      Exercises      General Comments        Pertinent Vitals/Pain Pain Assessment: 0-10 Pain Score: 3  Faces Pain Scale: Hurts a little bit Pain Location: groin, anteromedial L knee; aching in calf Pain Descriptors / Indicators: Burning;Aching Pain Intervention(s): Limited activity within patient's tolerance;Repositioned;Monitored during session    Home Living                      Prior Function            PT Goals (current goals can now be found in the care plan section) Acute Rehab PT Goals Patient Stated Goal: to leave hospital, get better and go back to work Progress towards PT goals: Progressing toward goals    Frequency    Min 2X/week      PT Plan Current plan remains appropriate    Co-evaluation              AM-PAC PT "6 Clicks" Mobility   Outcome Measure  Help needed turning from your back to your side while in a flat bed without using bedrails?: None Help needed moving from lying on your back to sitting on the side of a flat bed without using bedrails?: None Help needed moving to and from a bed to a chair (including a wheelchair)?: None Help needed standing up from a chair using your arms (e.g., wheelchair or bedside chair)?: None Help needed to walk in hospital room?: None Help needed climbing 3-5 steps with a railing? : None 6 Click Score: 24    End of Session Equipment Utilized During Treatment: Gait belt Activity Tolerance: Patient tolerated treatment well Patient left: in bed Nurse Communication: Mobility status PT Visit Diagnosis: Unsteadiness on feet (R26.81);Pain;Muscle weakness (generalized) (M62.81) Pain - Right/Left: Left Pain - part of body: Knee;Leg     Time: 7782-4235 PT Time Calculation (min) (ACUTE ONLY): 19 min  Charges:  $Gait Training: 8-22 mins                     Jetta Lout PTA 01/06/20, 9:25 AM

## 2020-01-06 NOTE — Clinical Social Work Note (Addendum)
Will go pick up patient's Eliquis and Gabapentin [facility] around 11:30. Patient agreeable to RW. Declined 3-in-1. Information given to [employer] representative for charity.  Charlynn Court, CSW [phone]  1:00 pm Medications and walker delivered to room. Patient plans on calling an [employer] Goad to get her back to her truck. No further concerns. CSW signing off.  Charlynn Court, CSW [phone]

## 2020-01-19 ENCOUNTER — Other Ambulatory Visit (INDEPENDENT_AMBULATORY_CARE_PROVIDER_SITE_OTHER): Payer: Self-pay | Admitting: Vascular Surgery

## 2020-01-19 DIAGNOSIS — Z9862 Peripheral vascular angioplasty status: Secondary | ICD-10-CM

## 2020-01-19 DIAGNOSIS — I82412 Acute embolism and thrombosis of left femoral vein: Secondary | ICD-10-CM

## 2020-01-20 ENCOUNTER — Encounter (INDEPENDENT_AMBULATORY_CARE_PROVIDER_SITE_OTHER): Payer: Self-pay

## 2020-01-20 ENCOUNTER — Ambulatory Visit (INDEPENDENT_AMBULATORY_CARE_PROVIDER_SITE_OTHER): Payer: Self-pay | Admitting: Nurse Practitioner

## 2020-03-10 ENCOUNTER — Encounter (INDEPENDENT_AMBULATORY_CARE_PROVIDER_SITE_OTHER): Payer: Self-pay | Admitting: Nurse Practitioner

## 2021-08-30 IMAGING — US US EXTREM LOW VENOUS*L*
1 series · 14 of 24 positions shown · non-contrast
Comparison: None.

CLINICAL DATA: PE.

EXAM:
LEFT LOWER EXTREMITY VENOUS DOPPLER ULTRASOUND
TECHNIQUE: Gray-scale sonography with compression, as well as color and duplex
ultrasound, were performed to evaluate the deep venous system(s)
from the level of the common femoral vein through the popliteal and
proximal calf veins.

[Series 1: us venous img lower uni left (dvt) · portal-venous · 14 of 33 slices shown]
[im 1/33]
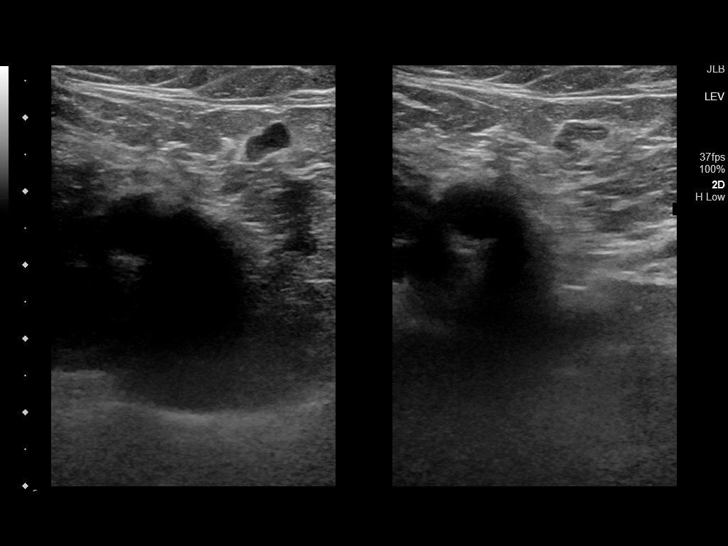
[im 3/33]
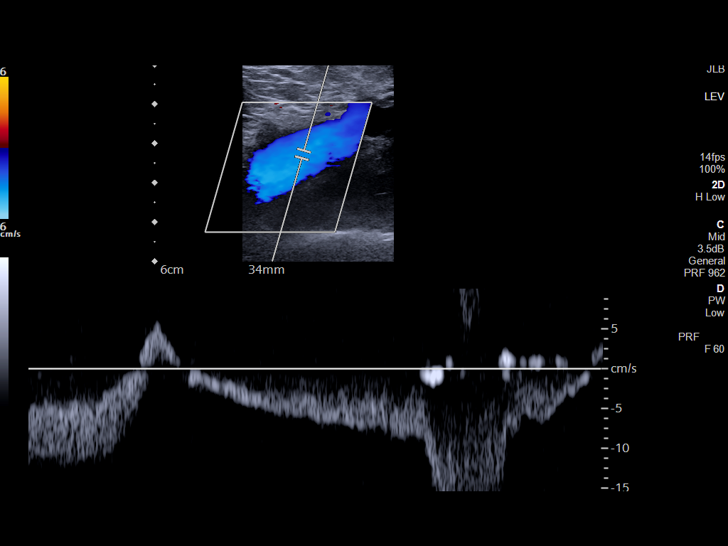
[im 6/33]
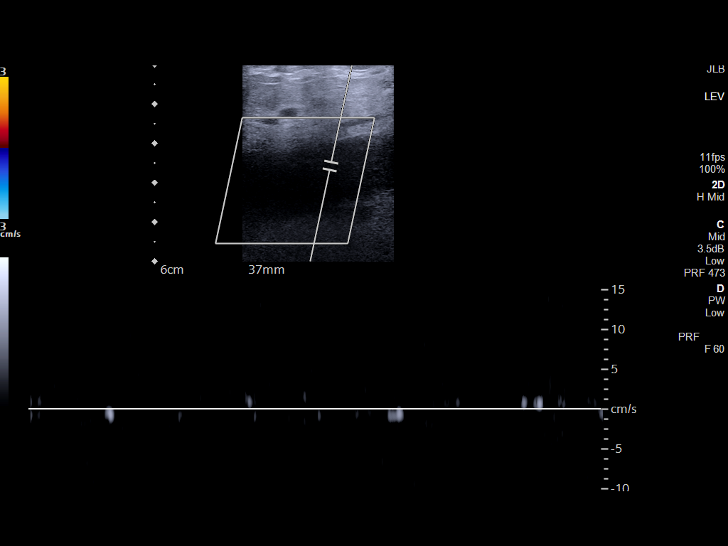
[im 9/33]
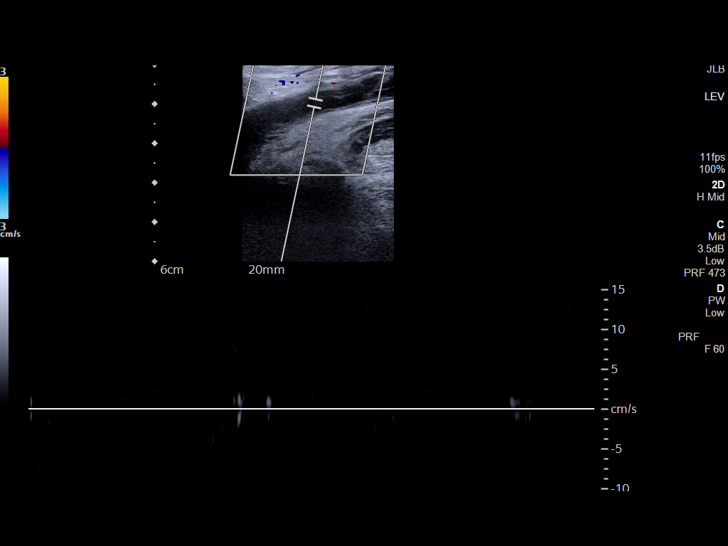
[im 10/33]
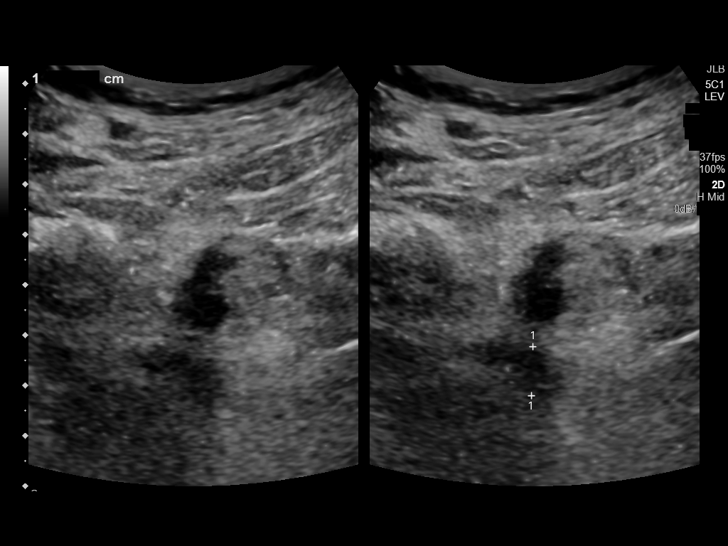
[im 13/33]
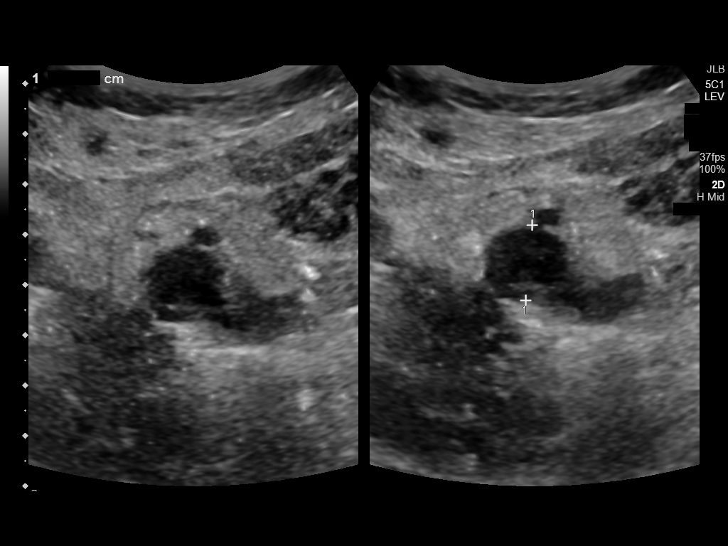
[im 16/33]
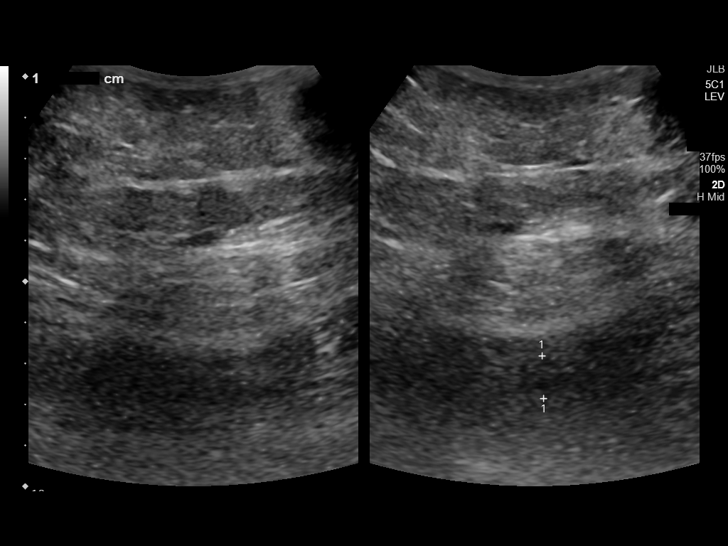
[im 17/33]
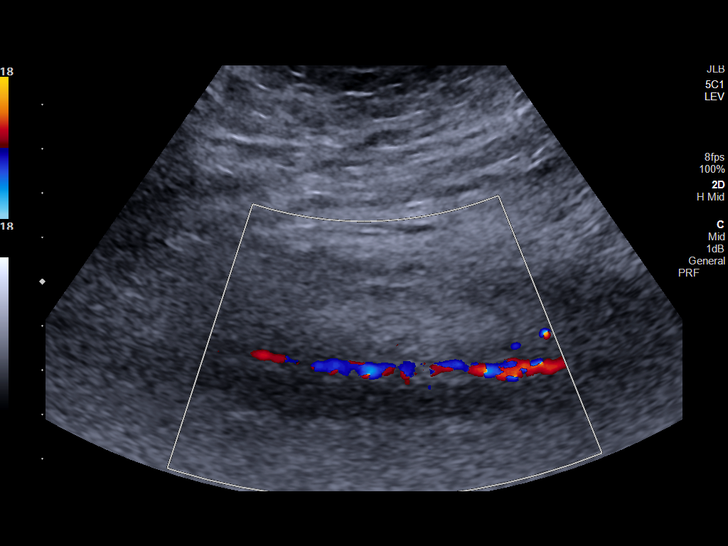
[im 20/33]
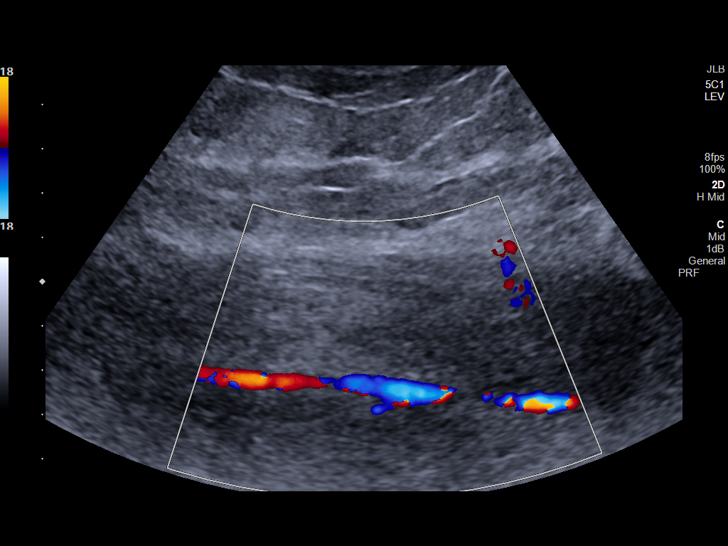
[im 23/33]
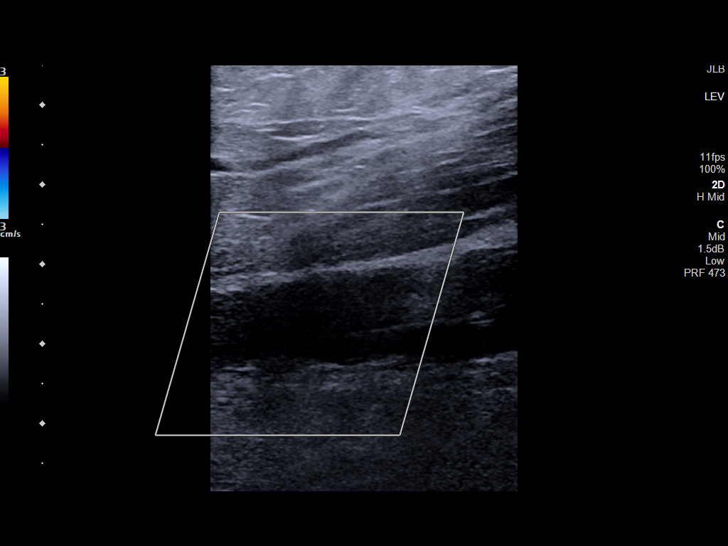
[im 26/33]
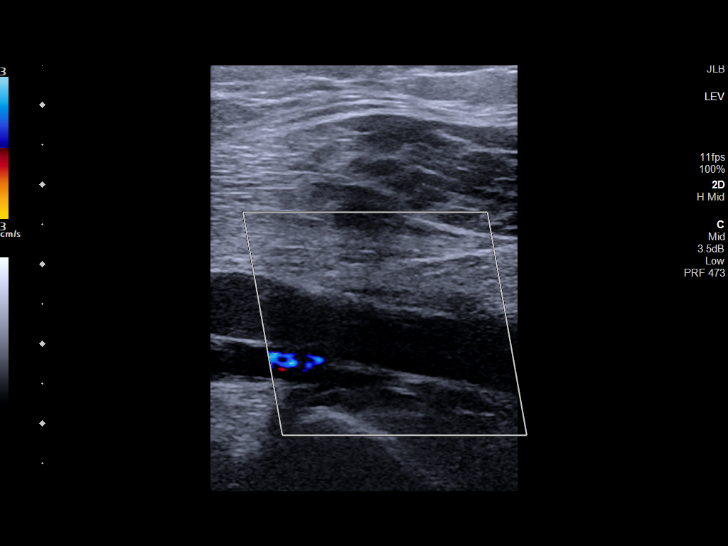
[im 27/33]
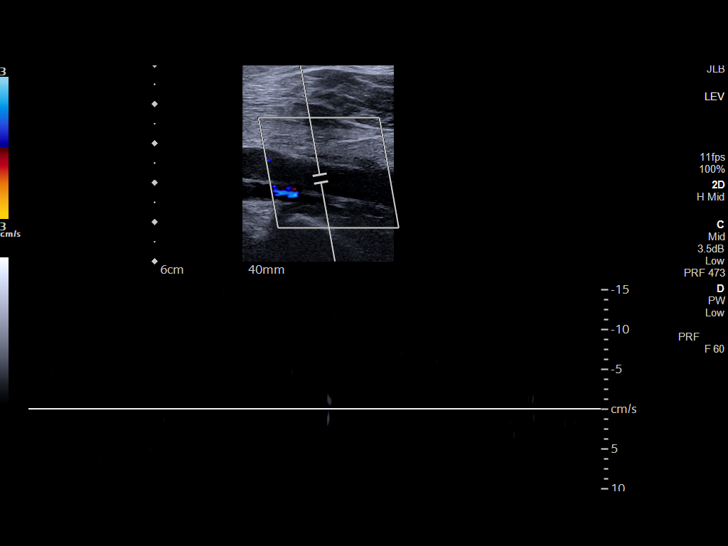
[im 30/33]
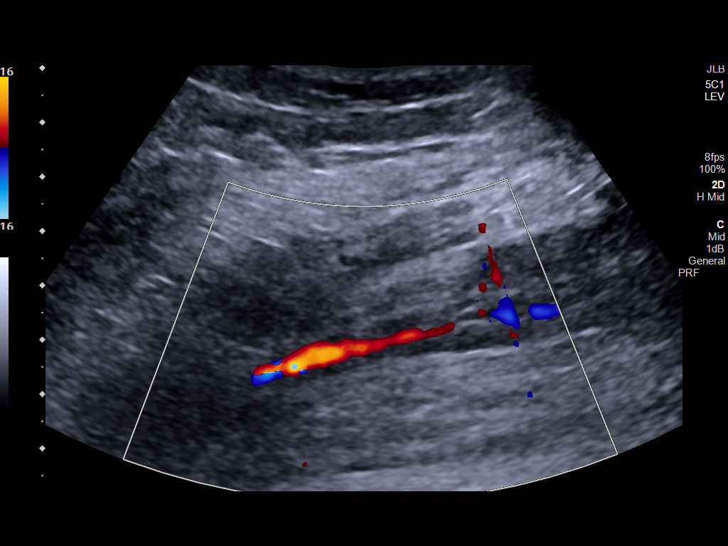
[im 33/33]
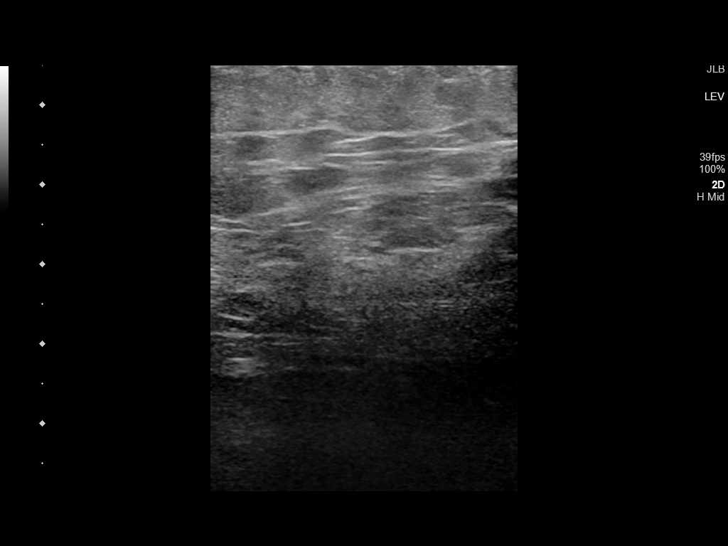

[14 of 24 positions shown; findings below may reference images not displayed]

FINDINGS: VENOUS

There is extensive occlusive thrombus throughout the left lower
extremity including the left CFV, profundus femoral, superficial
femoral vein, popliteal vein, and tibial veins.

Limited views of the contralateral common femoral vein are
unremarkable.

OTHER

Lower extremity edema is noted.

Limitations: none
IMPRESSION: Extensive occlusive thrombus is noted involving essentially all of
the deep veins of the left lower extremity.

These results were called by telephone at the time of interpretation
on 12/31/2019 at [DATE] to provider ASMARA HAMRAN , who verbally
acknowledged these results.
